# Patient Record
Sex: Female | Born: 1980 | Race: Black or African American | Hispanic: No | Marital: Single | Smoking: Never smoker
Health system: Southern US, Community
[De-identification: ages and names within clinical notes are randomized; demographics above are authoritative.]

## PROBLEM LIST (undated history)

## (undated) DIAGNOSIS — Z789 Other specified health status: Secondary | ICD-10-CM

## (undated) DIAGNOSIS — R87619 Unspecified abnormal cytological findings in specimens from cervix uteri: Secondary | ICD-10-CM

## (undated) DIAGNOSIS — IMO0002 Reserved for concepts with insufficient information to code with codable children: Secondary | ICD-10-CM

## (undated) HISTORY — PX: TONSILLECTOMY: SUR1361

## (undated) HISTORY — PX: COLPOSCOPY: SHX161

## (undated) HISTORY — DX: Other specified health status: Z78.9

## (undated) HISTORY — PX: DILATION AND CURETTAGE OF UTERUS: SHX78

---
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
## 2003-04-08 ENCOUNTER — Encounter: Admission: RE | Admit: 2003-04-08 | Discharge: 2003-04-08 | Payer: Self-pay | Admitting: Family Medicine

## 2003-04-15 ENCOUNTER — Encounter: Admission: RE | Admit: 2003-04-15 | Discharge: 2003-04-15 | Payer: Self-pay | Admitting: Obstetrics and Gynecology

## 2003-05-20 ENCOUNTER — Emergency Department (HOSPITAL_COMMUNITY): Admission: EM | Admit: 2003-05-20 | Discharge: 2003-05-20 | Payer: Self-pay | Admitting: Emergency Medicine

## 2003-05-22 ENCOUNTER — Emergency Department (HOSPITAL_COMMUNITY): Admission: EM | Admit: 2003-05-22 | Discharge: 2003-05-22 | Payer: Self-pay | Admitting: Emergency Medicine

## 2003-05-23 ENCOUNTER — Encounter: Payer: Self-pay | Admitting: Emergency Medicine

## 2005-06-09 ENCOUNTER — Inpatient Hospital Stay (HOSPITAL_COMMUNITY): Admission: AD | Admit: 2005-06-09 | Discharge: 2005-06-09 | Payer: Self-pay | Admitting: *Deleted

## 2005-06-10 ENCOUNTER — Ambulatory Visit (HOSPITAL_COMMUNITY): Admission: RE | Admit: 2005-06-10 | Discharge: 2005-06-10 | Payer: Self-pay | Admitting: Obstetrics

## 2005-06-10 ENCOUNTER — Encounter (INDEPENDENT_AMBULATORY_CARE_PROVIDER_SITE_OTHER): Payer: Self-pay | Admitting: *Deleted

## 2006-02-21 ENCOUNTER — Inpatient Hospital Stay (HOSPITAL_COMMUNITY): Admission: AD | Admit: 2006-02-21 | Discharge: 2006-02-21 | Payer: Self-pay | Admitting: Obstetrics

## 2006-07-11 ENCOUNTER — Ambulatory Visit (HOSPITAL_COMMUNITY): Admission: RE | Admit: 2006-07-11 | Discharge: 2006-07-11 | Payer: Self-pay | Admitting: Obstetrics

## 2006-11-22 ENCOUNTER — Inpatient Hospital Stay (HOSPITAL_COMMUNITY): Admission: AD | Admit: 2006-11-22 | Discharge: 2006-11-24 | Payer: Self-pay | Admitting: Obstetrics & Gynecology

## 2008-07-08 ENCOUNTER — Emergency Department (HOSPITAL_COMMUNITY): Admission: EM | Admit: 2008-07-08 | Discharge: 2008-07-08 | Payer: Self-pay | Admitting: Emergency Medicine

## 2009-11-27 ENCOUNTER — Ambulatory Visit (HOSPITAL_COMMUNITY): Admission: RE | Admit: 2009-11-27 | Discharge: 2009-11-27 | Payer: Self-pay | Admitting: Obstetrics

## 2010-04-15 ENCOUNTER — Inpatient Hospital Stay (HOSPITAL_COMMUNITY): Admission: AD | Admit: 2010-04-15 | Discharge: 2010-04-15 | Payer: Self-pay | Admitting: Obstetrics & Gynecology

## 2010-04-15 ENCOUNTER — Inpatient Hospital Stay (HOSPITAL_COMMUNITY): Admission: AD | Admit: 2010-04-15 | Discharge: 2010-04-18 | Payer: Self-pay | Admitting: Obstetrics & Gynecology

## 2010-04-15 ENCOUNTER — Encounter: Payer: Self-pay | Admitting: Obstetrics & Gynecology

## 2010-04-15 DIAGNOSIS — O321XX0 Maternal care for breech presentation, not applicable or unspecified: Secondary | ICD-10-CM

## 2010-08-29 ENCOUNTER — Emergency Department (HOSPITAL_COMMUNITY): Admission: EM | Admit: 2010-08-29 | Discharge: 2010-08-29 | Payer: Self-pay | Admitting: Emergency Medicine

## 2011-03-05 LAB — CBC
HCT: 31.5 % — ABNORMAL LOW (ref 36.0–46.0)
Hemoglobin: 10.5 g/dL — ABNORMAL LOW (ref 12.0–15.0)
MCHC: 33.3 g/dL (ref 30.0–36.0)
MCV: 90 fL (ref 78.0–100.0)
Platelets: 253 10*3/uL (ref 150–400)
RBC: 4.25 MIL/uL (ref 3.87–5.11)
RDW: 14.1 % (ref 11.5–15.5)

## 2011-03-05 LAB — RPR: RPR Ser Ql: NONREACTIVE

## 2011-05-03 NOTE — Op Note (Signed)
[name], Marie Watkins              ACCOUNT NO.:  [id number]   MEDICAL RECORD NO.:  [id number]          PATIENT TYPE:  AMB   LOCATION:  SDC                           FACILITY:  [facility]   PHYSICIAN:  Charles A. Clearance Coots, M.D.DATE OF BIRTH:  11-28-1981   DATE OF PROCEDURE:  06/10/2005  DATE OF DISCHARGE:                                 OPERATIVE REPORT   PREOPERATIVE DIAGNOSES:  Missed abortion.   POSTOPERATIVE DIAGNOSES:  Missed abortion.   PROCEDURE:  Suction, dilation and evacuation.   SURGEON:  Coral Ceo, MD   ANESTHESIA:  MAC with local paracervical block.   SPECIMEN:  Products of conception.   ESTIMATED BLOOD LOSS:  100 mL.   COMPLICATIONS:  None.   DESCRIPTION OF PROCEDURE:  The patient was brought to the operating room and  after satisfactory IV sedation, the legs were brought up in stirrups and the  vagina was prepped and draped in the usual sterile fashion.  The urinary  bladder was emptied of approximately 50 mL of clear urine. Bimanual  examination revealed the uterus to be slightly enlarged, anteverted. A  sterile speculum was inserted in the vaginal vault and the cervix was  isolated. Products of conception were seen in the cervical os and these  products of conception were grasped with ring forceps and submitted to  pathology for evaluation. The cervix was then dilated to a #25 Pratt  dilator. A #8 suction cath was then easily introduced into the uterine  cavity and all contents were evacuated. There was no active bleeding at the  conclusion of the procedure. All instruments were retired. The patient  tolerated the procedure well and was transported to the recovery room in  satisfactory condition.       CAH/MEDQ  D:  06/10/2005  T:  06/10/2005  Job:  [id number]

## 2011-09-13 LAB — POCT RAPID STREP A: Streptococcus, Group A Screen (Direct): POSITIVE — AB

## 2011-10-15 LAB — GC/CHLAMYDIA PROBE AMP, GENITAL
Chlamydia: NEGATIVE
Gonorrhea: NEGATIVE

## 2011-10-15 LAB — RUBELLA ANTIBODY, IGM: Rubella: IMMUNE

## 2011-10-15 LAB — ABO/RH: RH Type: POSITIVE

## 2011-12-11 LAB — RPR: RPR: NONREACTIVE

## 2011-12-17 NOTE — L&D Delivery Note (Signed)
Delivery Note At 4:04 PM a viable female was delivered via Vaginal, Spontaneous Delivery (Presentation: Right Occiput Anterior).  APGAR: , ; weight .   Placenta status: Intact, Spontaneous.  Cord:  with the following complications: .  Cord pH  Anesthesia: Epidural  Episiotomy: None Lacerations:  Suture Repair: 2.0 Est. Blood Loss (mL):   Mom to postpartum.  Baby to nursery-stable.  Alyzae Hawkey A 02/21/2012, 4:13 PM

## 2012-02-21 ENCOUNTER — Encounter (HOSPITAL_COMMUNITY): Payer: Self-pay | Admitting: Anesthesiology

## 2012-02-21 ENCOUNTER — Encounter (HOSPITAL_COMMUNITY): Payer: Self-pay

## 2012-02-21 ENCOUNTER — Inpatient Hospital Stay (HOSPITAL_COMMUNITY): Payer: Medicaid Other | Admitting: Anesthesiology

## 2012-02-21 ENCOUNTER — Inpatient Hospital Stay (HOSPITAL_COMMUNITY)
Admission: AD | Admit: 2012-02-21 | Discharge: 2012-02-23 | DRG: 775 | Disposition: A | Discharge status: Home / Self Care | Payer: Medicaid Other | Source: Ambulatory Visit | Attending: Obstetrics | Admitting: Obstetrics

## 2012-02-21 HISTORY — DX: Other specified health status: Z78.9

## 2012-02-21 HISTORY — DX: Reserved for concepts with insufficient information to code with codable children: IMO0002

## 2012-02-21 HISTORY — DX: Unspecified abnormal cytological findings in specimens from cervix uteri: R87.619

## 2012-02-21 LAB — RPR: RPR Ser Ql: NONREACTIVE

## 2012-02-21 LAB — CBC
MCH: 27.4 pg (ref 26.0–34.0)
MCHC: 32.1 g/dL (ref 30.0–36.0)
MCV: 85.4 fL (ref 78.0–100.0)
Platelets: 269 10*3/uL (ref 150–400)
RDW: 13.6 % (ref 11.5–15.5)

## 2012-02-21 LAB — STREP B DNA PROBE

## 2012-02-21 MED ORDER — SIMETHICONE 80 MG PO CHEW: 80.0000 mg | CHEWABLE_TABLET | ORAL | Status: DC | PRN

## 2012-02-21 MED ORDER — FENTANYL 2.5 MCG/ML BUPIVACAINE 1/10 % EPIDURAL INFUSION (WH - ANES)
14.0000 mL/h | INTRAMUSCULAR | Status: DC
  Administered 2012-02-21: 14 mL/h via EPIDURAL
  Filled 2012-02-21 (×2): qty 60

## 2012-02-21 MED ORDER — OXYTOCIN BOLUS FROM INFUSION
500.0000 mL | Freq: Once | INTRAVENOUS | Status: AC
  Administered 2012-02-21: 500 mL via INTRAVENOUS
  Filled 2012-02-21: qty 500
  Filled 2012-02-21: qty 1000

## 2012-02-21 MED ORDER — ONDANSETRON HCL 4 MG PO TABS: 4.0000 mg | ORAL_TABLET | ORAL | Status: DC | PRN

## 2012-02-21 MED ORDER — WITCH HAZEL-GLYCERIN EX PADS: 1.0000 "application " | MEDICATED_PAD | CUTANEOUS | Status: DC | PRN

## 2012-02-21 MED ORDER — FERROUS SULFATE 325 (65 FE) MG PO TABS
325.0000 mg | ORAL_TABLET | Freq: Two times a day (BID) | ORAL | Status: DC
  Administered 2012-02-22 – 2012-02-23 (×3): 325 mg via ORAL
  Filled 2012-02-21 (×3): qty 1

## 2012-02-21 MED ORDER — TETANUS-DIPHTH-ACELL PERTUSSIS 5-2.5-18.5 LF-MCG/0.5 IM SUSP
0.5000 mL | Freq: Once | INTRAMUSCULAR | Status: AC
  Administered 2012-02-22: 0.5 mL via INTRAMUSCULAR
  Filled 2012-02-21: qty 0.5

## 2012-02-21 MED ORDER — OXYTOCIN 20 UNITS IN LACTATED RINGERS INFUSION - SIMPLE: 125.0000 mL/h | Freq: Once | INTRAVENOUS | Status: DC

## 2012-02-21 MED ORDER — LACTATED RINGERS IV SOLN
INTRAVENOUS | Status: DC
  Administered 2012-02-21 (×2): via INTRAVENOUS

## 2012-02-21 MED ORDER — CITRIC ACID-SODIUM CITRATE 334-500 MG/5ML PO SOLN: 30.0000 mL | ORAL | Status: DC | PRN

## 2012-02-21 MED ORDER — BENZOCAINE-MENTHOL 20-0.5 % EX AERO: 1.0000 "application " | INHALATION_SPRAY | CUTANEOUS | Status: DC | PRN

## 2012-02-21 MED ORDER — EPHEDRINE 5 MG/ML INJ
10.0000 mg | INTRAVENOUS | Status: DC | PRN
  Filled 2012-02-21: qty 2
  Filled 2012-02-21: qty 4

## 2012-02-21 MED ORDER — DIPHENHYDRAMINE HCL 50 MG/ML IJ SOLN: 12.5000 mg | INTRAMUSCULAR | Status: DC | PRN

## 2012-02-21 MED ORDER — ONDANSETRON HCL 4 MG/2ML IJ SOLN: 4.0000 mg | INTRAMUSCULAR | Status: DC | PRN

## 2012-02-21 MED ORDER — NALBUPHINE SYRINGE 5 MG/0.5 ML
10.0000 mg | INJECTION | Freq: Four times a day (QID) | INTRAMUSCULAR | Status: DC | PRN
  Filled 2012-02-21: qty 1

## 2012-02-21 MED ORDER — ONDANSETRON HCL 4 MG/2ML IJ SOLN: 4.0000 mg | Freq: Four times a day (QID) | INTRAMUSCULAR | Status: DC | PRN

## 2012-02-21 MED ORDER — OXYCODONE-ACETAMINOPHEN 5-325 MG PO TABS: 1.0000 | ORAL_TABLET | ORAL | Status: DC | PRN

## 2012-02-21 MED ORDER — DIPHENHYDRAMINE HCL 25 MG PO CAPS: 25.0000 mg | ORAL_CAPSULE | Freq: Four times a day (QID) | ORAL | Status: DC | PRN

## 2012-02-21 MED ORDER — IBUPROFEN 600 MG PO TABS
600.0000 mg | ORAL_TABLET | Freq: Four times a day (QID) | ORAL | Status: DC
  Administered 2012-02-22 – 2012-02-23 (×6): 600 mg via ORAL
  Filled 2012-02-21 (×6): qty 1

## 2012-02-21 MED ORDER — PHENYLEPHRINE 40 MCG/ML (10ML) SYRINGE FOR IV PUSH (FOR BLOOD PRESSURE SUPPORT)
80.0000 ug | PREFILLED_SYRINGE | INTRAVENOUS | Status: DC | PRN
  Filled 2012-02-21: qty 2
  Filled 2012-02-21: qty 5

## 2012-02-21 MED ORDER — EPHEDRINE 5 MG/ML INJ
10.0000 mg | INTRAVENOUS | Status: DC | PRN
  Filled 2012-02-21: qty 2

## 2012-02-21 MED ORDER — PHENYLEPHRINE 40 MCG/ML (10ML) SYRINGE FOR IV PUSH (FOR BLOOD PRESSURE SUPPORT)
80.0000 ug | PREFILLED_SYRINGE | INTRAVENOUS | Status: DC | PRN
  Filled 2012-02-21: qty 2

## 2012-02-21 MED ORDER — SENNOSIDES-DOCUSATE SODIUM 8.6-50 MG PO TABS
2.0000 | ORAL_TABLET | Freq: Every day | ORAL | Status: DC
  Administered 2012-02-21 – 2012-02-22 (×2): 2 via ORAL

## 2012-02-21 MED ORDER — PRENATAL MULTIVITAMIN CH
1.0000 | ORAL_TABLET | Freq: Every day | ORAL | Status: DC
  Administered 2012-02-22 – 2012-02-23 (×2): 1 via ORAL
  Filled 2012-02-21 (×2): qty 1

## 2012-02-21 MED ORDER — ACETAMINOPHEN 325 MG PO TABS: 650.0000 mg | ORAL_TABLET | ORAL | Status: DC | PRN

## 2012-02-21 MED ORDER — LACTATED RINGERS IV SOLN: 500.0000 mL | INTRAVENOUS | Status: DC | PRN

## 2012-02-21 MED ORDER — NALBUPHINE SYRINGE 5 MG/0.5 ML
10.0000 mg | INJECTION | INTRAMUSCULAR | Status: DC | PRN
  Filled 2012-02-21: qty 1

## 2012-02-21 MED ORDER — LACTATED RINGERS IV SOLN
500.0000 mL | Freq: Once | INTRAVENOUS | Status: AC
  Administered 2012-02-21: 500 mL via INTRAVENOUS

## 2012-02-21 MED ORDER — LIDOCAINE HCL (PF) 1 % IJ SOLN
30.0000 mL | INTRAMUSCULAR | Status: DC | PRN
  Filled 2012-02-21 (×2): qty 30

## 2012-02-21 MED ORDER — HYDROXYZINE HCL 50 MG/ML IM SOLN
50.0000 mg | Freq: Four times a day (QID) | INTRAMUSCULAR | Status: DC | PRN
  Filled 2012-02-21: qty 1

## 2012-02-21 MED ORDER — DIBUCAINE 1 % RE OINT: 1.0000 "application " | TOPICAL_OINTMENT | RECTAL | Status: DC | PRN

## 2012-02-21 MED ORDER — ZOLPIDEM TARTRATE 5 MG PO TABS: 5.0000 mg | ORAL_TABLET | Freq: Every evening | ORAL | Status: DC | PRN

## 2012-02-21 MED ORDER — OXYCODONE-ACETAMINOPHEN 5-325 MG PO TABS
1.0000 | ORAL_TABLET | ORAL | Status: DC | PRN
  Administered 2012-02-22: 1 via ORAL
  Filled 2012-02-21: qty 1

## 2012-02-21 MED ORDER — LANOLIN HYDROUS EX OINT: TOPICAL_OINTMENT | CUTANEOUS | Status: DC | PRN

## 2012-02-21 MED ORDER — SODIUM BICARBONATE 8.4 % IV SOLN
INTRAVENOUS | Status: DC | PRN
  Administered 2012-02-21: 4 mL via EPIDURAL

## 2012-02-21 MED ORDER — IBUPROFEN 600 MG PO TABS: 600.0000 mg | ORAL_TABLET | Freq: Four times a day (QID) | ORAL | Status: DC | PRN

## 2012-02-21 MED ORDER — FLEET ENEMA 7-19 GM/118ML RE ENEM: 1.0000 | ENEMA | RECTAL | Status: DC | PRN

## 2012-02-21 MED ORDER — FENTANYL 2.5 MCG/ML BUPIVACAINE 1/10 % EPIDURAL INFUSION (WH - ANES)
INTRAMUSCULAR | Status: DC | PRN
  Administered 2012-02-21: 14 mL/h via EPIDURAL

## 2012-02-21 NOTE — Progress Notes (Signed)
Marie Watkins is a 31 y.o. G5P3 at [redacted]w[redacted]d by LMP admitted for active labor  Subjective:   Objective: BP 117/75  Pulse 87  Temp(Src) 97.9 F (36.6 C) (Oral)  Resp 20  Ht 5\' 5"  (1.651 m)  Wt 191 lb (86.637 kg)  BMI 31.78 kg/m2  LMP 06/16/2011      FHT:  FHR: 150 bpm, variability: moderate,  accelerations:  Present,  decelerations:  Absent UC:   regular, every 3 minutes SVE:   Dilation: 3 Effacement (%): 100 Station: -1 Exam by:: Laurell Josephs RN  Labs: Lab Results  Component Value Date   WBC 14.5* 02/21/2012   HGB 11.3* 02/21/2012   HCT 35.2* 02/21/2012   MCV 85.4 02/21/2012   PLT 269 02/21/2012    Assessment / Plan: Spontaneous labor, progressing normally  Labor: Progressing normally Preeclampsia:  n/a Fetal Wellbeing:  Category I Pain Control:  Labor support without medications I/D:  n/a Anticipated MOD:  NSVD  Jaquay Morneault A 02/21/2012, 10:49 AM

## 2012-02-21 NOTE — Anesthesia Procedure Notes (Signed)

## 2012-02-21 NOTE — H&P (Signed)
Marie Watkins is a 31 y.o. female presenting for uterine contractions. Maternal Medical History:  Reason for admission: Reason for admission: contractions.  Contractions: Onset was 3-5 hours ago.   Frequency: regular.   Duration is approximately 1 minute.   Perceived severity is strong.    Fetal activity: Perceived fetal activity is normal.   Last perceived fetal movement was within the past hour.    Prenatal complications: no prenatal complications Prenatal Complications - Diabetes: none.    OB History    Grav Para Term Preterm Abortions TAB SAB Ect Mult Living   5 3        3      Past Medical History  Diagnosis Date  . No pertinent past medical history   . Abnormal Pap smear    Past Surgical History  Procedure Date  . Colposcopy   . Cesarean section   . Tonsillectomy    Family History: family history is not on file. Social History:  reports that she has quit smoking. She has never used smokeless tobacco. She reports that she does not drink alcohol or use illicit drugs.  Review of Systems  All other systems reviewed and are negative.    Dilation: 3 Effacement (%): 100 Station: -1 Exam by:: Laurell Josephs RN Blood pressure 117/75, pulse 87, temperature 97.9 F (36.6 C), temperature source Oral, resp. rate 20, height 5\' 5"  (1.651 m), weight 191 lb (86.637 kg), last menstrual period 06/16/2011. Maternal Exam:  Uterine Assessment: Contraction strength is firm.  Abdomen: Fetal presentation: vertex  Introitus: Normal vulva. Normal vagina.  Cervix: Cervix evaluated by digital exam.     Physical Exam  Constitutional: She is oriented to person, place, and time. She appears well-developed and well-nourished.  Eyes: Conjunctivae are normal. Pupils are equal, round, and reactive to light.  Neck: Normal range of motion. Neck supple.  Cardiovascular: Normal rate and regular rhythm.   Respiratory: Effort normal.  GI: Soft.  Genitourinary: Vagina normal and uterus  normal.  Musculoskeletal: Normal range of motion.  Neurological: She is alert and oriented to person, place, and time.  Skin: Skin is warm and dry.  Psychiatric: She has a normal mood and affect. Her behavior is normal. Judgment and thought content normal.    Prenatal labs: ABO, Rh:   Antibody:   Rubella:   RPR:    HBsAg:    HIV:    GBS:     Assessment/Plan: 35.5 weeks.  Active labor.  Admit.  Expectant management.   Tildon Silveria A 02/21/2012, 10:44 AM

## 2012-02-21 NOTE — Anesthesia Preprocedure Evaluation (Signed)

## 2012-02-21 NOTE — Progress Notes (Signed)
Marie Watkins is a 31 y.o. [phone] at [redacted]w[redacted]d by ultrasound admitted for active labor  Subjective:   Objective: BP 140/74  Pulse 118  Temp(Src) 98.2 F (36.8 C) (Oral)  Resp 20  Ht 5\' 5"  (1.651 m)  Wt 191 lb (86.637 kg)  BMI 31.78 kg/m2  LMP 06/16/2011      FHT:  FHR: 150 bpm, variability: moderate,  accelerations:  Present,  decelerations:  Absent UC:   regular, every 3 minutes SVE:   Dilation: 6.5 Effacement (%): 100 Station: -1 Exam by:: Isac Sarna, RN  Labs: Lab Results  Component Value Date   WBC 14.5* 02/21/2012   HGB 11.3* 02/21/2012   HCT 35.2* 02/21/2012   MCV 85.4 02/21/2012   PLT 269 02/21/2012    Assessment / Plan: Spontaneous labor, progressing normally  Labor: Progressing normally Preeclampsia:  n/a Fetal Wellbeing:  Category I Pain Control:  Epidural I/D:  n/a Anticipated MOD:  NSVD  Zafar Debrosse A 02/21/2012, 2:06 PM

## 2012-02-22 LAB — CBC
HCT: 34.9 % — ABNORMAL LOW (ref 36.0–46.0)
Platelets: 247 10*3/uL (ref 150–400)
RDW: 13.8 % (ref 11.5–15.5)
WBC: 14 10*3/uL — ABNORMAL HIGH (ref 4.0–10.5)

## 2012-02-22 NOTE — Progress Notes (Signed)
Patient ID: Marie Watkins, female   DOB: 08-02-1981, 31 y.o.   MRN: [id number] Postpartum day one Bile signs normal Fundus firm Legs negative No complaints and

## 2012-02-22 NOTE — Anesthesia Postprocedure Evaluation (Signed)
  Anesthesia Post-op Note  Patient: Marie Watkins  Procedure(s) Performed: * No procedures listed *  Patient Location: PACU and Mother/Baby  Anesthesia Type: Epidural  Level of Consciousness: awake, alert  and oriented  Airway and Oxygen Therapy: Patient Spontanous Breathing  Post-op Pain: none  Post-op Assessment: Post-op Vital signs reviewed and Patient's Cardiovascular Status Stable  Post-op Vital Signs: Reviewed and stable  Complications: No apparent anesthesia complications

## 2012-02-23 NOTE — Discharge Summary (Signed)
Obstetric Discharge Summary Reason for Admission: onset of labor Prenatal Procedures: none Intrapartum Procedures: spontaneous vaginal delivery Postpartum Procedures: none Complications-Operative and Postpartum: none Hemoglobin  Date Value Range Status  02/22/2012 11.1* 12.0-15.0 (g/dL) Final     HCT  Date Value Range Status  02/22/2012 34.9* 36.0-46.0 (%) Final    Discharge Diagnoses: Term Pregnancy-delivered  Discharge Information: Date: 02/23/2012 Activity: pelvic rest Diet: routine Medications: Percocet Condition: stable Instructions: refer to practice specific booklet Discharge to: home Follow-up Information    Follow up with HARPER,CHARLES A, MD. Call in 6 weeks.   Contact information:   [address] [place] [address] [phone]          Newborn Data: Live born female  Birth Weight: 5 lb 11.4 oz (2591 g) APGAR: 8, 9  Home with mother.  Kasen Sako A 02/23/2012, 6:05 AM

## 2012-02-23 NOTE — Discharge Instructions (Signed)
Discharge instructions   You can wash your hair  Shower  Eat what you want  Drink what you want  See me in 6 weeks  Your ankles are going to swell more in the next 2 weeks than when pregnant  No sex for 6 weeks   Koston Hennes A, MD 02/23/2012    

## 2012-02-25 NOTE — Progress Notes (Signed)
Post discharge chart review completed.  

## 2013-03-05 ENCOUNTER — Emergency Department (HOSPITAL_COMMUNITY)
Admission: EM | Admit: 2013-03-05 | Discharge: 2013-03-05 | Disposition: A | Discharge status: Home / Self Care | Payer: No Typology Code available for payment source | Source: Home / Self Care | Attending: Family Medicine | Admitting: Family Medicine

## 2013-03-05 ENCOUNTER — Encounter (HOSPITAL_COMMUNITY): Payer: Self-pay | Admitting: *Deleted

## 2013-03-05 DIAGNOSIS — Z3201 Encounter for pregnancy test, result positive: Secondary | ICD-10-CM

## 2013-03-05 DIAGNOSIS — K047 Periapical abscess without sinus: Secondary | ICD-10-CM

## 2013-03-05 DIAGNOSIS — R21 Rash and other nonspecific skin eruption: Secondary | ICD-10-CM

## 2013-03-05 DIAGNOSIS — Z3490 Encounter for supervision of normal pregnancy, unspecified, unspecified trimester: Secondary | ICD-10-CM

## 2013-03-05 MED ORDER — NYSTATIN-TRIAMCINOLONE 100000-0.1 UNIT/GM-% EX OINT: TOPICAL_OINTMENT | Freq: Two times a day (BID) | CUTANEOUS | Status: DC

## 2013-03-05 MED ORDER — HYDROCODONE-ACETAMINOPHEN 5-325 MG PO TABS: 1.0000 | ORAL_TABLET | Freq: Four times a day (QID) | ORAL | Status: DC | PRN

## 2013-03-05 MED ORDER — PENICILLIN V POTASSIUM 500 MG PO TABS: 500.0000 mg | ORAL_TABLET | Freq: Four times a day (QID) | ORAL | Status: AC

## 2013-03-05 NOTE — ED Notes (Signed)
Referral faxed to [facility]-waiting for an appt 

## 2013-03-05 NOTE — ED Provider Notes (Signed)
History     CSN: [id number]  Arrival date & time 03/05/13  1259   None     Chief Complaint  Patient presents with  . Dental Pain    (Consider location/radiation/quality/duration/timing/severity/associated sxs/prior treatment) HPI Comments: Pt reports part of tooth broke off 2 months ago, and for last 2 weeks has been painful. Also c/o rash for a month on neck not relieved by neosporin or ring worm cream or hydrocortisone. Pt has not had a period since November, but checked a pregnancy test last month and it was negative.    Patient is a 32 y.o. female presenting with tooth pain. The history is provided by the patient.  Dental PainThe primary symptoms include mouth pain. Primary symptoms do not include fever. Episode onset: 2 weeks ago. The symptoms are worsening. The symptoms are new. The symptoms occur constantly.  Additional symptoms include: gum swelling. Medical issues include: periodontal disease.    Past Medical History  Diagnosis Date  . No pertinent past medical history   . Abnormal Pap smear     Past Surgical History  Procedure Laterality Date  . Colposcopy    . Cesarean section    . Tonsillectomy      History reviewed. No pertinent family history.  History  Substance Use Topics  . Smoking status: Former Games developer  . Smokeless tobacco: Never Used  . Alcohol Use: No    OB History   Grav Para Term Preterm Abortions TAB SAB Ect Mult Living   6 4 3 1 2  0 2 0 0 4      Review of Systems  Constitutional: Negative for fever and chills.  HENT: Positive for dental problem.   Skin: Positive for rash.    Allergies  Review of patient's allergies indicates no known allergies.  Home Medications   Current Outpatient Rx  Name  Route  Sig  Dispense  Refill  . HYDROcodone-acetaminophen (NORCO/VICODIN) 5-325 MG per tablet   Oral   Take 1-2 tablets by mouth every 6 (six) hours as needed for pain.   10 tablet   0   . nystatin-triamcinolone ointment (MYCOLOG)  Topical   Apply topically 2 (two) times daily.   30 g   0   . penicillin v potassium (VEETID) 500 MG tablet   Oral   Take 1 tablet (500 mg total) by mouth 4 (four) times daily.   40 tablet   0     BP 127/90  Pulse 88  Temp(Src) 98.6 F (37 C) (Oral)  Resp 14  SpO2 99%  LMP 11/12/2012  Breastfeeding? No  Physical Exam  Constitutional: She appears well-developed and well-nourished. No distress.  HENT:  Mouth/Throat: Dental abscesses and dental caries present.    Dentition in poor repair  Cardiovascular: Normal rate and regular rhythm.   Pulmonary/Chest: Effort normal and breath sounds normal.  Abdominal: Soft. Bowel sounds are normal. She exhibits no distension. There is no tenderness.  Skin: Skin is warm, dry and intact. Rash noted. Rash is maculopapular.  Rash across back of neck    ED Course  Procedures (including critical care time)  Labs Reviewed  POCT PREGNANCY, URINE - Abnormal; Notable for the following:    Preg Test, Ur POSITIVE (*)    All other components within normal limits   No results found.   1. Dental abscess   2. Rash of neck   3. Pregnancy       MDM  Unable to palpate fundus so pt is likely  still in early pregnancy.  Discussed options. Pt wants to pursue abortion. Referred to [facility]. Rash could be a tinea or a contact derm. Will treat with mycolog.  Pt to f/u with dentist for further care of broken tooth.         Cathlyn Parsons, NP 03/05/13 1505

## 2013-03-05 NOTE — ED Notes (Signed)
Left lower dental pain for 2 weeks    She recently had a root canal on the same tooth.  Denies fever.   Also she c/o rash on her neck the past month or two

## 2013-03-06 NOTE — ED Provider Notes (Signed)
Medical screening examination/treatment/procedure(s) were performed by non-physician practitioner and as supervising physician I was immediately available for consultation/collaboration.   MORENO-COLL,Jemar Paulsen; MD  Brilynn Biasi Moreno-Coll, MD 03/06/13 1247 

## 2013-03-09 NOTE — ED Notes (Signed)
[facility]

## 2013-09-14 ENCOUNTER — Emergency Department (INDEPENDENT_AMBULATORY_CARE_PROVIDER_SITE_OTHER)
Admission: EM | Admit: 2013-09-14 | Discharge: 2013-09-14 | Disposition: A | Discharge status: Home / Self Care | Payer: Self-pay | Source: Home / Self Care | Attending: Family Medicine | Admitting: Family Medicine

## 2013-09-14 ENCOUNTER — Encounter (HOSPITAL_COMMUNITY): Payer: Self-pay | Admitting: Emergency Medicine

## 2013-09-14 DIAGNOSIS — M5412 Radiculopathy, cervical region: Secondary | ICD-10-CM

## 2013-09-14 MED ORDER — PREDNISONE 10 MG PO KIT: PACK | ORAL | Status: DC

## 2013-09-14 MED ORDER — TRAMADOL HCL 50 MG PO TABS: 50.0000 mg | ORAL_TABLET | Freq: Four times a day (QID) | ORAL | Status: DC | PRN

## 2013-09-14 NOTE — ED Provider Notes (Signed)
Marie Watkins is a 32 y.o. female who presents to Urgent Care today for right arm pain from right shoulder to right thumb. Pain is not worse with activity. Does not seem to be better with rest. She denies any neck pain or pain with neck motion. She denies any weakness or numbness. She describes the pain is a e are sharp. She denies any injury. She has not tried any medications yet. It sometimes bothersome. She feels well otherwise.   Past Medical History  Diagnosis Date  . No pertinent past medical history   . Abnormal Pap smear    History  Substance Use Topics  . Smoking status: Former Games developer  . Smokeless tobacco: Never Used  . Alcohol Use: No   ROS as above Medications reviewed. No current facility-administered medications for this encounter.   Current Outpatient Prescriptions  Medication Sig Dispense Refill  . PredniSONE 10 MG KIT 12 day dose pack po  1 kit  0  . traMADol (ULTRAM) 50 MG tablet Take 1 tablet (50 mg total) by mouth every 6 (six) hours as needed for pain.  15 tablet  0    Exam:  BP 130/81  Pulse 62  Temp(Src) 98.7 F (37.1 C) (Oral)  Resp 12  SpO2 100%  LMP 08/09/2013  Breastfeeding? No Gen: Well NAD NECK: Nontender to spinal midline normal neck range of motion negative Spurling's test bilaterally Right shoulder: Nontender normal motion negative impingement testing strength is intact full motion Left shoulder: Full motion and strength nontender negative impingement testing Right elbow: Nontender full motion full-strength Left elbow: Nontender full motion full-strength Right hand and wrist: Normal-appearing nontender full motion negative Finkelstein's test Left hand and wrist: Full motion nontender Grip strength bilaterally is equal and normal Sensation capillary fill are intact distally bilateral upper extremity  No results found for this or any previous visit (from the past 24 hour(s)). No results found.  Assessment and Plan: 32 y.o. female with  likely cervical radicular pain to the right arm.  Plan to treat with prednisone Dosepak and tramadol Followup with [facility] if not improving Discussed warning signs or symptoms. Please see discharge instructions. Patient expresses understanding.      Rodolph Bong, MD 09/14/13 [phone]

## 2013-09-14 NOTE — ED Notes (Signed)
C/o right arm pain that comes and goes. Onset x 1 month ago. Pt states that she has a sharp pain that radiates from shoulder through out arm down to thumb. Denies injury. Pt has not tried any otc meds for symptoms.

## 2013-09-27 ENCOUNTER — Ambulatory Visit: Payer: Self-pay

## 2013-12-23 ENCOUNTER — Other Ambulatory Visit (HOSPITAL_COMMUNITY)
Admission: RE | Admit: 2013-12-23 | Discharge: 2013-12-23 | Disposition: A | Discharge status: Home / Self Care | Payer: No Typology Code available for payment source | Source: Ambulatory Visit | Attending: Internal Medicine | Admitting: Internal Medicine

## 2013-12-23 ENCOUNTER — Encounter: Payer: Self-pay | Admitting: Internal Medicine

## 2013-12-23 ENCOUNTER — Ambulatory Visit: Payer: No Typology Code available for payment source | Attending: Internal Medicine | Admitting: Internal Medicine

## 2013-12-23 VITALS — BP 133/91 | HR 83 | Temp 98.4°F | Resp 16 | Ht 66.0 in | Wt 164.0 lb

## 2013-12-23 DIAGNOSIS — Z0000 Encounter for general adult medical examination without abnormal findings: Secondary | ICD-10-CM

## 2013-12-23 DIAGNOSIS — Z1151 Encounter for screening for human papillomavirus (HPV): Secondary | ICD-10-CM | POA: Insufficient documentation

## 2013-12-23 DIAGNOSIS — Z113 Encounter for screening for infections with a predominantly sexual mode of transmission: Secondary | ICD-10-CM | POA: Insufficient documentation

## 2013-12-23 DIAGNOSIS — N760 Acute vaginitis: Secondary | ICD-10-CM | POA: Insufficient documentation

## 2013-12-23 DIAGNOSIS — Z01419 Encounter for gynecological examination (general) (routine) without abnormal findings: Secondary | ICD-10-CM | POA: Insufficient documentation

## 2013-12-23 LAB — CBC WITH DIFFERENTIAL/PLATELET
Basophils Absolute: 0 10*3/uL (ref 0.0–0.1)
Basophils Relative: 0 % (ref 0–1)
EOS ABS: 0.1 10*3/uL (ref 0.0–0.7)
Eosinophils Relative: 1 % (ref 0–5)
HCT: 37.3 % (ref 36.0–46.0)
HEMOGLOBIN: 12.8 g/dL (ref 12.0–15.0)
LYMPHS ABS: 3.3 10*3/uL (ref 0.7–4.0)
LYMPHS PCT: 36 % (ref 12–46)
MCH: 27.7 pg (ref 26.0–34.0)
MCHC: 34.3 g/dL (ref 30.0–36.0)
MCV: 80.7 fL (ref 78.0–100.0)
MONOS PCT: 7 % (ref 3–12)
Monocytes Absolute: 0.6 10*3/uL (ref 0.1–1.0)
NEUTROS ABS: 5.2 10*3/uL (ref 1.7–7.7)
NEUTROS PCT: 56 % (ref 43–77)
PLATELETS: 264 10*3/uL (ref 150–400)
RBC: 4.62 MIL/uL (ref 3.87–5.11)
RDW: 13.9 % (ref 11.5–15.5)
WBC: 9.2 10*3/uL (ref 4.0–10.5)

## 2013-12-23 LAB — CMP AND LIVER
ALT: 10 U/L (ref 0–35)
AST: 14 U/L (ref 0–37)
Albumin: 4.6 g/dL (ref 3.5–5.2)
Alkaline Phosphatase: 62 U/L (ref 39–117)
BILIRUBIN DIRECT: 0.1 mg/dL (ref 0.0–0.3)
BILIRUBIN INDIRECT: 0.2 mg/dL (ref 0.0–0.9)
BILIRUBIN TOTAL: 0.3 mg/dL (ref 0.3–1.2)
BUN: 12 mg/dL (ref 6–23)
CALCIUM: 9.7 mg/dL (ref 8.4–10.5)
CHLORIDE: 102 meq/L (ref 96–112)
CO2: 26 meq/L (ref 19–32)
CREATININE: 0.86 mg/dL (ref 0.50–1.10)
Glucose, Bld: 78 mg/dL (ref 70–99)
Potassium: 3.7 mEq/L (ref 3.5–5.3)
Sodium: 137 mEq/L (ref 135–145)
Total Protein: 7.9 g/dL (ref 6.0–8.3)

## 2013-12-23 LAB — LIPID PANEL
CHOL/HDL RATIO: 3.7 ratio
CHOLESTEROL: 173 mg/dL (ref 0–200)
HDL: 47 mg/dL (ref >39)
LDL CALC: 114 mg/dL — AB (ref 0–99)
TRIGLYCERIDES: 61 mg/dL (ref <150)
VLDL: 12 mg/dL (ref 0–40)

## 2013-12-23 NOTE — Progress Notes (Signed)
Pt is here to establish care.  Pt is requesting a physical and a pap smear.  

## 2013-12-23 NOTE — Patient Instructions (Signed)
DASH Diet  The DASH diet stands for "Dietary Approaches to Stop Hypertension." It is a healthy eating plan that has been shown to reduce high blood pressure (hypertension) in as little as 14 days, while also possibly providing other significant health benefits. These other health benefits include reducing the risk of breast cancer after menopause and reducing the risk of type 2 diabetes, heart disease, colon cancer, and stroke. Health benefits also include weight loss and slowing kidney failure in patients with chronic kidney disease.   DIET GUIDELINES  · Limit salt (sodium). Your diet should contain less than 1500 mg of sodium daily.  · Limit refined or processed carbohydrates. Your diet should include mostly whole grains. Desserts and added sugars should be used sparingly.  · Include small amounts of heart-healthy fats. These types of fats include nuts, oils, and tub margarine. Limit saturated and trans fats. These fats have been shown to be harmful in the body.  CHOOSING FOODS   The following food groups are based on a 2000 calorie diet. See your Registered Dietitian for individual calorie needs.  Grains and Grain Products (6 to 8 servings daily)  · Eat More Often: Whole-wheat bread, brown rice, whole-grain or wheat pasta, quinoa, popcorn without added fat or salt (air popped).  · Eat Less Often: White bread, white pasta, white rice, cornbread.  Vegetables (4 to 5 servings daily)  · Eat More Often: Fresh, frozen, and canned vegetables. Vegetables may be raw, steamed, roasted, or grilled with a minimal amount of fat.  · Eat Less Often/Avoid: Creamed or fried vegetables. Vegetables in a cheese sauce.  Fruit (4 to 5 servings daily)  · Eat More Often: All fresh, canned (in natural juice), or frozen fruits. Dried fruits without added sugar. One hundred percent fruit juice (½ cup [237 mL] daily).  · Eat Less Often: Dried fruits with added sugar. Canned fruit in light or heavy syrup.  Lean Meats, Fish, and Poultry (2  servings or less daily. One serving is 3 to 4 oz [85-114 g]).  · Eat More Often: Ninety percent or leaner ground beef, tenderloin, sirloin. Round cuts of beef, chicken breast, turkey breast. All fish. Grill, bake, or broil your meat. Nothing should be fried.  · Eat Less Often/Avoid: Fatty cuts of meat, turkey, or chicken leg, thigh, or wing. Fried cuts of meat or fish.  Dairy (2 to 3 servings)  · Eat More Often: Low-fat or fat-free milk, low-fat plain or light yogurt, reduced-fat or part-skim cheese.  · Eat Less Often/Avoid: Milk (whole, 2%). Whole milk yogurt. Full-fat cheeses.  Nuts, Seeds, and Legumes (4 to 5 servings per week)  · Eat More Often: All without added salt.  · Eat Less Often/Avoid: Salted nuts and seeds, canned beans with added salt.  Fats and Sweets (limited)  · Eat More Often: Vegetable oils, tub margarines without trans fats, sugar-free gelatin. Mayonnaise and salad dressings.  · Eat Less Often/Avoid: Coconut oils, palm oils, butter, stick margarine, cream, half and half, cookies, candy, pie.  FOR MORE INFORMATION  The Dash Diet Eating Plan: www.dashdiet.org  Document Released: 11/21/2011 Document Revised: 02/24/2012 Document Reviewed: 11/21/2011  ExitCare® Patient Information ©2014 [employer].

## 2013-12-23 NOTE — Progress Notes (Signed)
**Note De-Identified Marie Obfuscation** Patient ID: Marie Watkins, female   DOB: March 17, 1981, 33 y.o.   MRN: [id number] Patient Demographics  Marie Watkins, is a 33 y.o. female  SEZ:[id number]  UQM:[id number]  DOB - 02-Feb-1981  CC:  Chief Complaint  Patient presents with  . Establish Care       HPI: Marie Watkins is a 33 y.o. female here today to establish medical care. Patient has no significant past medical history, she's here for annual physical examination. She has 4 children, married, last child about 3 years, last pregnancy was February 2014, terminated. She's not on any medication. She has good social support, doing well at home and at work, she works as a Furniture conservator/restorer. She does not smoke cigarette, she does not drink alcohol. Patient has No headache, No chest pain, No abdominal pain - No Nausea, No new weakness tingling or numbness, No Cough - SOB.  No Known Allergies Past Medical History  Diagnosis Date  . No pertinent past medical history   . Abnormal Pap smear    Current Outpatient Prescriptions on File Prior to Visit  Medication Sig Dispense Refill  . PredniSONE 10 MG KIT 12 day dose pack po  1 kit  0  . traMADol (ULTRAM) 50 MG tablet Take 1 tablet (50 mg total) by mouth every 6 (six) hours as needed for pain.  15 tablet  0   No current facility-administered medications on file prior to visit.   History reviewed. No pertinent family history. History   Social History  . Marital Status: Single    Spouse Name: N/A    Number of Children: N/A  . Years of Education: N/A   Occupational History  . Not on file.   Social History Main Topics  . Smoking status: Former Games developer  . Smokeless tobacco: Never Used  . Alcohol Use: No  . Drug Use: No  . Sexual Activity: Yes    Birth Control/ Protection: None   Other Topics Concern  . Not on file   Social History Narrative  . No narrative on file    Review of Systems: Constitutional: Negative for fever, chills, diaphoresis, activity change, appetite change  and fatigue. HENT: Negative for ear pain, nosebleeds, congestion, facial swelling, rhinorrhea, neck pain, neck stiffness and ear discharge.  Eyes: Negative for pain, discharge, redness, itching and visual disturbance. Respiratory: Negative for cough, choking, chest tightness, shortness of breath, wheezing and stridor.  Cardiovascular: Negative for chest pain, palpitations and leg swelling. Gastrointestinal: Negative for abdominal distention. Genitourinary: Negative for dysuria, urgency, frequency, hematuria, flank pain, decreased urine volume, difficulty urinating and dyspareunia.  Musculoskeletal: Negative for back pain, joint swelling, arthralgia and gait problem. Neurological: Negative for dizziness, tremors, seizures, syncope, facial asymmetry, speech difficulty, weakness, light-headedness, numbness and headaches.  Hematological: Negative for adenopathy. Does not bruise/bleed easily. Psychiatric/Behavioral: Negative for hallucinations, behavioral problems, confusion, dysphoric mood, decreased concentration and agitation.    Objective:   Filed Vitals:   12/23/13 1419  BP: 133/91  Pulse: 83  Temp: 98.4 F (36.9 C)  Resp: 16    Physical Exam: Constitutional: Patient appears well-developed and well-nourished. No distress. HENT: Normocephalic, atraumatic, External right and left ear normal. Oropharynx is clear and moist.  Eyes: Conjunctivae and EOM are normal. PERRLA, no scleral icterus. Neck: Normal ROM. Neck supple. No JVD. No tracheal deviation. No thyromegaly. CVS: RRR, S1/S2 +, no murmurs, no gallops, no carotid bruit.  Pulmonary: Effort and breath sounds normal, no stridor, rhonchi, wheezes, rales.  Abdominal: Soft. BS +, no distension,  tenderness, rebound or guarding.  Musculoskeletal: Normal range of motion. No edema and no tenderness.  Lymphadenopathy: No lymphadenopathy noted, cervical, inguinal or axillary Neuro: Alert. Normal reflexes, muscle tone coordination. No cranial  nerve deficit. Skin: Skin is warm and dry. No rash noted. Not diaphoretic. No erythema. No pallor. Psychiatric: Normal mood and affect. Behavior, judgment, thought content normal.  Lab Results  Component Value Date   WBC 14.0* 02/22/2012   HGB 11.1* 02/22/2012   HCT 34.9* 02/22/2012   MCV 85.7 02/22/2012   PLT 247 02/22/2012   No results found for this basename: CREATININE, BUN, NA, K, CL, CO2    No results found for this basename: HGBA1C   Lipid Panel  No results found for this basename: chol, trig, hdl, cholhdl, vldl, ldlcalc       Assessment and plan:   1. Preventative health care  - Cytology - PAP [place] - Cervicovaginal ancillary only - CMP and Liver - CBC with Differential - Urinalysis, Complete - Lipid panel  Patient extensively counseled about nutrition and exercise  Follow up in 3-6 months or when necessary   The patient was given clear instructions to go to ER or return to medical center if symptoms don't improve, worsen or new problems develop. The patient verbalized understanding. The patient was told to call to get lab results if they haven't heard anything in the next week.     Angelica Chessman, MD, [place], [place], [place], [place]   12/23/2013, 2:44 PM

## 2013-12-24 LAB — URINALYSIS, COMPLETE
Bacteria, UA: NONE SEEN
Bilirubin Urine: NEGATIVE
CASTS: NONE SEEN
Crystals: NONE SEEN
Glucose, UA: NEGATIVE mg/dL
HGB URINE DIPSTICK: NEGATIVE
Ketones, ur: NEGATIVE mg/dL
LEUKOCYTES UA: NEGATIVE
NITRITE: NEGATIVE
PH: 6 (ref 5.0–8.0)
Protein, ur: NEGATIVE mg/dL
SPECIFIC GRAVITY, URINE: 1.028 (ref 1.005–1.030)
Urobilinogen, UA: 0.2 mg/dL (ref 0.0–1.0)

## 2013-12-31 ENCOUNTER — Telehealth: Payer: Self-pay | Admitting: Emergency Medicine

## 2013-12-31 NOTE — Telephone Encounter (Signed)
Left message for pt to call clinic for result

## 2013-12-31 NOTE — Telephone Encounter (Signed)
Message copied by Darlis LoanSMITH, JILL D on Fri Dec 31, 2013  5:47 PM ------      Message from: Quentin AngstJEGEDE, OLUGBEMIGA E      Created: Fri Dec 31, 2013  4:14 PM       Please inform patient that her Pap smear result is normal ------

## 2014-01-04 ENCOUNTER — Other Ambulatory Visit: Payer: Self-pay | Admitting: Emergency Medicine

## 2014-01-04 ENCOUNTER — Telehealth: Payer: Self-pay | Admitting: Emergency Medicine

## 2014-01-04 MED ORDER — METRONIDAZOLE 500 MG PO TABS: 2000.0000 mg | ORAL_TABLET | Freq: Once | ORAL | Status: DC

## 2014-01-04 NOTE — Telephone Encounter (Signed)
Pt called in requesting pap smear/blood work results Results given and script for Flagyl 2g e-scribed to [facility]

## 2014-05-12 DIAGNOSIS — M79609 Pain in unspecified limb: Secondary | ICD-10-CM

## 2014-10-17 ENCOUNTER — Encounter: Payer: Self-pay | Admitting: Internal Medicine

## 2014-12-16 NOTE — L&D Delivery Note (Signed)
Delivery Note At 4:54 AM a viable female was delivered via Vaginal, Spontaneous Delivery (Presentation: ; Occiput Anterior).  APGAR: , ; weight  .   Placenta status: Intact, Spontaneous.  Cord: 3 vessels with the following complications: None.  Cord pH: not done  Anesthesia: None  Episiotomy: None Lacerations: None Suture Repair: 2.0 Est. Blood Loss (mL): 142  Mom to postpartum.  Baby to Couplet care / Skin to Skin.  Rithvik Orcutt A 06/10/2015, 5:25 AM

## 2014-12-19 ENCOUNTER — Telehealth: Payer: Self-pay | Admitting: *Deleted

## 2014-12-19 NOTE — Telephone Encounter (Signed)
Patient is interested in scheduling a NOB appointment. Attempted to contact patient and left message for her to contact the office.

## 2014-12-20 NOTE — Telephone Encounter (Signed)
Patient scheduled for NOB appointment on 01-12-15. Patient encouraged to take prenatal vitamins. Given safe OTC meds for nausea. Patient made aware of no show policy. Patient verbalized understanding.

## 2014-12-26 ENCOUNTER — Telehealth: Payer: Self-pay | Admitting: *Deleted

## 2014-12-26 NOTE — Telephone Encounter (Signed)
Patient requesting a call back.  Attempted to contact the patient and left message for patient to call the office.

## 2015-01-02 NOTE — Telephone Encounter (Signed)
Patient states she was told by someone that she was too far along to be seen here and would need to be [facility]. Advised patient that our cutoff for accepting new pregnant patients is 24 weeks. Patient advised only [facility] if [facility] is an emergency. Patient advised she would need to bring her confirmation of pregnancy to her first appointment.

## 2015-01-12 ENCOUNTER — Other Ambulatory Visit: Payer: Self-pay | Admitting: Obstetrics

## 2015-01-12 ENCOUNTER — Telehealth: Payer: Self-pay

## 2015-01-12 ENCOUNTER — Ambulatory Visit (INDEPENDENT_AMBULATORY_CARE_PROVIDER_SITE_OTHER): Payer: Medicaid Other | Admitting: Obstetrics

## 2015-01-12 ENCOUNTER — Encounter: Payer: Self-pay | Admitting: Obstetrics

## 2015-01-12 VITALS — BP 126/78 | HR 110 | Temp 97.6°F | Wt 176.0 lb

## 2015-01-12 DIAGNOSIS — Z3490 Encounter for supervision of normal pregnancy, unspecified, unspecified trimester: Secondary | ICD-10-CM

## 2015-01-12 DIAGNOSIS — O09212 Supervision of pregnancy with history of pre-term labor, second trimester: Secondary | ICD-10-CM

## 2015-01-12 DIAGNOSIS — Z3482 Encounter for supervision of other normal pregnancy, second trimester: Secondary | ICD-10-CM

## 2015-01-12 DIAGNOSIS — O09892 Supervision of other high risk pregnancies, second trimester: Secondary | ICD-10-CM

## 2015-01-12 LAB — POCT URINALYSIS DIPSTICK
Bilirubin, UA: NEGATIVE
Glucose, UA: NEGATIVE
KETONES UA: NEGATIVE
LEUKOCYTES UA: NEGATIVE
NITRITE UA: NEGATIVE
PROTEIN UA: NEGATIVE
Spec Grav, UA: 1.01
Urobilinogen, UA: NEGATIVE
pH, UA: 6.5

## 2015-01-12 NOTE — Telephone Encounter (Signed)
Called patient with appt date and time for ultrasound, etc at [facility] on Feb 2nd at 8am

## 2015-01-13 ENCOUNTER — Encounter: Payer: Self-pay | Admitting: Obstetrics

## 2015-01-13 LAB — OBSTETRIC PANEL
ANTIBODY SCREEN: NEGATIVE
BASOS ABS: 0 10*3/uL (ref 0.0–0.1)
BASOS PCT: 0 % (ref 0–1)
Eosinophils Absolute: 0.1 10*3/uL (ref 0.0–0.7)
Eosinophils Relative: 1 % (ref 0–5)
HCT: 35.7 % — ABNORMAL LOW (ref 36.0–46.0)
HEMOGLOBIN: 12 g/dL (ref 12.0–15.0)
HEP B S AG: NEGATIVE
LYMPHS ABS: 2.6 10*3/uL (ref 0.7–4.0)
Lymphocytes Relative: 20 % (ref 12–46)
MCH: 27.7 pg (ref 26.0–34.0)
MCHC: 33.6 g/dL (ref 30.0–36.0)
MCV: 82.4 fL (ref 78.0–100.0)
MONO ABS: 1.3 10*3/uL — AB (ref 0.1–1.0)
MPV: 9.6 fL (ref 8.6–12.4)
Monocytes Relative: 10 % (ref 3–12)
NEUTROS ABS: 8.9 10*3/uL — AB (ref 1.7–7.7)
NEUTROS PCT: 69 % (ref 43–77)
Platelets: 324 10*3/uL (ref 150–400)
RBC: 4.33 MIL/uL (ref 3.87–5.11)
RDW: 13.5 % (ref 11.5–15.5)
RH TYPE: POSITIVE
RUBELLA: 1.71 {index} — AB (ref <0.90)
WBC: 12.9 10*3/uL — AB (ref 4.0–10.5)

## 2015-01-13 LAB — HIV ANTIBODY (ROUTINE TESTING W REFLEX): HIV 1&2 Ab, 4th Generation: NONREACTIVE

## 2015-01-13 LAB — AFP, QUAD SCREEN
AFP: 50.2 ng/mL
Age Alone: 1:572 {titer}
Curr Gest Age: 17.3 wks.days
HCG TOTAL: 62.06 [IU]/mL
INH: 221.7 pg/mL
Interpretation-AFP: NEGATIVE
MOM FOR HCG: 2.11
MoM for AFP: 1.2
MoM for INH: 1.4
Open Spina bifida: NEGATIVE
Osb Risk: 1:13800 {titer}
Tri 18 Scr Risk Est: NEGATIVE
Trisomy 18 (Edward) Syndrome Interp.: 1:28000 {titer}
UE3 MOM: 0.67
uE3 Value: 0.74 ng/mL

## 2015-01-13 LAB — PAP IG AND HPV HIGH-RISK: HPV DNA High Risk: NOT DETECTED

## 2015-01-13 LAB — CULTURE, OB URINE: Colony Count: 50000

## 2015-01-13 LAB — VITAMIN D 25 HYDROXY (VIT D DEFICIENCY, FRACTURES): Vit D, 25-Hydroxy: 14 ng/mL — ABNORMAL LOW (ref 30–100)

## 2015-01-13 LAB — OB RESULTS CONSOLE GBS: GBS: POSITIVE

## 2015-01-13 LAB — VARICELLA ZOSTER ANTIBODY, IGG: Varicella IgG: 988.3 Index — ABNORMAL HIGH (ref <135.00)

## 2015-01-13 NOTE — Progress Notes (Signed)
Subjective:    Marie Watkins is being seen today for her first obstetrical visit.  This is not a planned pregnancy. She is at 6716w4d gestation. Her obstetrical history is significant for H/O preterm delivery.  Patient does intend to breast feed. Pregnancy history fully reviewed.  The information documented in the HPI was reviewed and verified.  Menstrual History: OB History    Gravida Para Term Preterm AB TAB SAB Ectopic Multiple Living   7 4 3 1 2  0 2 0 0 4      Patient's last menstrual period was 09/12/2014.    Past Medical History  Diagnosis Date  . No pertinent past medical history   . Abnormal Pap smear   . Medical history non-contributory     Past Surgical History  Procedure Laterality Date  . Colposcopy    . Cesarean section    . Tonsillectomy    . Dilation and curettage of uterus       (Not in a hospital admission) No Known Allergies  History  Substance Use Topics  . Smoking status: Former Games developermoker  . Smokeless tobacco: Never Used  . Alcohol Use: No    Family History  Problem Relation Age of Onset  . Family history unknown: Yes     Review of Systems Constitutional: negative for weight loss Gastrointestinal: negative for vomiting Genitourinary:negative for genital lesions and vaginal discharge and dysuria Musculoskeletal:negative for back pain Behavioral/Psych: negative for abusive relationship, depression, illegal drug usage and tobacco use    Objective:    BP 126/78 mmHg  Pulse 110  Temp(Src) 97.6 F (36.4 C)  Wt 176 lb (79.833 kg)  LMP 09/12/2014 General Appearance:    Alert, cooperative, no distress, appears stated age  Head:    Normocephalic, without obvious abnormality, atraumatic  Eyes:    PERRL, conjunctiva/corneas clear, EOM's intact, fundi    benign, both eyes  Ears:    Normal TM's and external ear canals, both ears  Nose:   Nares normal, septum midline, mucosa normal, no drainage    or sinus tenderness  Throat:   Lips, mucosa, and  tongue normal; teeth and gums normal  Neck:   Supple, symmetrical, trachea midline, no adenopathy;    thyroid:  no enlargement/tenderness/nodules; no carotid   bruit or JVD  Back:     Symmetric, no curvature, ROM normal, no CVA tenderness  Lungs:     Clear to auscultation bilaterally, respirations unlabored  Chest Wall:    No tenderness or deformity   Heart:    Regular rate and rhythm, S1 and S2 normal, no murmur, rub   or gallop  Breast Exam:    No tenderness, masses, or nipple abnormality  Abdomen:     Soft, non-tender, bowel sounds active all four quadrants,    no masses, no organomegaly  Genitalia:    Normal female without lesion, discharge or tenderness  Extremities:   Extremities normal, atraumatic, no cyanosis or edema  Pulses:   2+ and symmetric all extremities  Skin:   Skin color, texture, turgor normal, no rashes or lesions  Lymph nodes:   Cervical, supraclavicular, and axillary nodes normal  Neurologic:   CNII-XII intact, normal strength, sensation and reflexes    throughout      Lab Review Urine pregnancy test Labs reviewed yes Radiologic studies reviewed no Assessment:    Pregnancy at 5616w4d weeks    Plan:      Prenatal vitamins.  Counseling provided regarding continued use of seat belts, cessation  of alcohol consumption, smoking or use of illicit drugs; infection precautions i.e., influenza/TDAP immunizations, toxoplasmosis,CMV, parvovirus, listeria and varicella; workplace safety, exercise during pregnancy; routine dental care, safe medications, sexual activity, hot tubs, saunas, pools, travel, caffeine use, fish and methlymercury, potential toxins, hair treatments, varicose veins Weight gain recommendations per IOM guidelines reviewed: underweight/BMI< 18.5--> gain 28 - 40 lbs; normal weight/BMI 18.5 - 24.9--> gain 25 - 35 lbs; overweight/BMI 25 - 29.9--> gain 15 - 25 lbs; obese/BMI >30->gain  11 - 20 lbs Problem list reviewed and updated. FIRST/CF mutation  testing/NIPT/QUAD SCREEN/fragile X/[name] population testing/Spinal muscular atrophy discussed: requested. Role of ultrasound in pregnancy discussed; fetal survey: requested. Amniocentesis discussed: not indicated. VBAC calculator score: VBAC consent form provided No orders of the defined types were placed in this encounter.   Orders Placed This Encounter  Procedures  . Culture, OB Urine  . SureSwab, Vaginosis/Vaginitis Plus  . US OB Comp + 14 Wk    Standing Status: Future     Number of Occurrences:      Standing Expiration Date: 03/12/2016    Order Specific Question:  Reason for Exam (SYMPTOM  OR DIAGNOSIS REQUIRED)    Answer:  Previous preterm delivery    Order Specific Question:  Preferred imaging location?    Answer:  [facility]  . Obstetric panel  . HIV antibody  . Hemoglobinopathy evaluation  . Varicella zoster antibody, IgG  . Vit D  25 hydroxy (rtn osteoporosis monitoring)  . AFP, Quad Screen    Order Specific Question:  Repeat Sample    Answer:  No    Order Specific Question:  Maternal Race    Answer:  african american    Order Specific Question:  EDD    Answer:  06/19/2015    Order Specific Question:  Brief History NTD    Answer:  no    Order Specific Question:  Pregnancy Donor Egg (Y/N)    Answer:  No    Order Specific Question:  Gest Age at U/S (Wk.Dy)    Answer:  none    Order Specific Question:  LMP:    Answer:  09/12/2014    Order Specific Question:  Number of Fetuses    Answer:  1    Order Specific Question:  Hx of OSB/NTD?    Answer:  No    Order Specific Question:  History of Down Syndrome?    Answer:  No    Order Specific Question:  Maternal IDDM (insulin-dependent diabetes mellitus)    Answer:  No    Order Specific Question:  Maternal Weight (lbs)    Answer:  176    Order Specific Question:  Brief History DS    Answer:  none  . AMB Referral to Maternal Fetal Medicine (MFM)    Referral Priority:  Routine    Referral Type:  Consultation     Number of Visits Requested:  1  . POCT urinalysis dipstick    Follow up in 4 weeks.

## 2015-01-14 ENCOUNTER — Other Ambulatory Visit: Payer: Self-pay | Admitting: Obstetrics

## 2015-01-14 DIAGNOSIS — N390 Urinary tract infection, site not specified: Secondary | ICD-10-CM

## 2015-01-14 MED ORDER — AMOXICILLIN 500 MG PO CAPS: 500.0000 mg | ORAL_CAPSULE | Freq: Three times a day (TID) | ORAL | Status: DC

## 2015-01-16 ENCOUNTER — Other Ambulatory Visit: Payer: Self-pay | Admitting: *Deleted

## 2015-01-16 DIAGNOSIS — O219 Vomiting of pregnancy, unspecified: Secondary | ICD-10-CM

## 2015-01-16 LAB — SURESWAB, VAGINOSIS/VAGINITIS PLUS
Atopobium vaginae: NOT DETECTED Log (cells/mL)
C. ALBICANS, DNA: DETECTED — AB
C. glabrata, DNA: NOT DETECTED
C. parapsilosis, DNA: NOT DETECTED
C. trachomatis RNA, TMA: NOT DETECTED
C. tropicalis, DNA: NOT DETECTED
Gardnerella vaginalis: NOT DETECTED Log (cells/mL)
LACTOBACILLUS SPECIES: 5.4 Log (cells/mL)
MEGASPHAERA SPECIES: NOT DETECTED Log (cells/mL)
N. gonorrhoeae RNA, TMA: NOT DETECTED
T. vaginalis RNA, QL TMA: NOT DETECTED

## 2015-01-16 LAB — HEMOGLOBINOPATHY EVALUATION
HEMOGLOBIN OTHER: 0 %
HGB A2 QUANT: 2.9 % (ref 2.2–3.2)
HGB F QUANT: 0 % (ref 0.0–2.0)
Hgb A: 97.1 % (ref 96.8–97.8)
Hgb S Quant: 0 %

## 2015-01-16 MED ORDER — ONDANSETRON 4 MG PO TBDP: 4.0000 mg | ORAL_TABLET | Freq: Four times a day (QID) | ORAL | Status: DC | PRN

## 2015-01-16 MED ORDER — OB COMPLETE PETITE 35-5-1-200 MG PO CAPS: 1.0000 | ORAL_CAPSULE | Freq: Every day | ORAL | Status: DC

## 2015-01-16 NOTE — Progress Notes (Signed)
PNV sent to pharmacy 

## 2015-01-17 ENCOUNTER — Other Ambulatory Visit: Payer: Self-pay | Admitting: Obstetrics

## 2015-01-17 ENCOUNTER — Ambulatory Visit (HOSPITAL_COMMUNITY)
Admission: RE | Admit: 2015-01-17 | Discharge: 2015-01-17 | Disposition: A | Discharge status: Home / Self Care | Payer: Medicaid Other | Source: Ambulatory Visit | Attending: Obstetrics | Admitting: Obstetrics

## 2015-01-17 ENCOUNTER — Other Ambulatory Visit (HOSPITAL_COMMUNITY): Payer: Self-pay | Admitting: Maternal and Fetal Medicine

## 2015-01-17 ENCOUNTER — Encounter (HOSPITAL_COMMUNITY): Payer: Self-pay

## 2015-01-17 VITALS — BP 129/78 | HR 97 | Wt 179.8 lb

## 2015-01-17 DIAGNOSIS — O4402 Placenta previa specified as without hemorrhage, second trimester: Secondary | ICD-10-CM | POA: Diagnosis not present

## 2015-01-17 DIAGNOSIS — O09212 Supervision of pregnancy with history of pre-term labor, second trimester: Principal | ICD-10-CM

## 2015-01-17 DIAGNOSIS — O3421 Maternal care for scar from previous cesarean delivery: Secondary | ICD-10-CM | POA: Insufficient documentation

## 2015-01-17 DIAGNOSIS — Z3A18 18 weeks gestation of pregnancy: Secondary | ICD-10-CM | POA: Diagnosis not present

## 2015-01-17 DIAGNOSIS — O09892 Supervision of other high risk pregnancies, second trimester: Secondary | ICD-10-CM

## 2015-01-17 DIAGNOSIS — Z87891 Personal history of nicotine dependence: Secondary | ICD-10-CM | POA: Insufficient documentation

## 2015-01-17 DIAGNOSIS — B3731 Acute candidiasis of vulva and vagina: Secondary | ICD-10-CM

## 2015-01-17 DIAGNOSIS — B373 Candidiasis of vulva and vagina: Secondary | ICD-10-CM

## 2015-01-17 MED ORDER — FLUCONAZOLE 150 MG PO TABS: 150.0000 mg | ORAL_TABLET | Freq: Once | ORAL | Status: DC

## 2015-01-17 NOTE — Consult Note (Signed)
Maternal Fetal Medicine Consultation  Requesting Provider(s): Coral Ceo, MD  Reason for consultation: History of preterm delivery at 35 weeks  HPI: Marie Watkins is a 34 yo [id number] currently at 18w 1d who was seen for consultation due to a history of a previous preterm delivery at 35 weeks. Marie Watkins reports a history of two previous term SVDs and a prior term C-section for breech presentation.  Her most recent delivery in 2013 was a spontaneous preterm delivery at 35 weeks without complications.  Her prenatal course thus far has otherwise been uncomplicated.  She is without complaints today.  OB History: OB History    Gravida Para Term Preterm AB TAB SAB Ectopic Multiple Living   0 2 0 0 4      PMH:  Past Medical History  Diagnosis Date  . No pertinent past medical history   . Abnormal Pap smear   . Medical history non-contributory     PSH:  Past Surgical History  Procedure Laterality Date  . Colposcopy    . Cesarean section    . Tonsillectomy    . Dilation and curettage of uterus     Meds:  Current Outpatient Prescriptions on File Prior to Encounter  Medication Sig Dispense Refill  . amoxicillin (AMOXIL) 500 MG capsule Take 1 capsule (500 mg total) by mouth 3 (three) times daily. 21 capsule 0  . ondansetron (ZOFRAN ODT) 4 MG disintegrating tablet Take 1 tablet (4 mg total) by mouth every 6 (six) hours as needed for nausea or vomiting. 30 tablet 2  . Prenat-FeCbn-FeAspGl-FA-Omega (OB COMPLETE PETITE) 35-5-1-200 MG CAPS Take 1 capsule by mouth daily. 30 capsule 11   No current facility-administered medications on file prior to encounter.   Allergies: No Known Allergies   FH: Family History  Problem Relation Age of Onset  . Family history unknown: Yes   Soc: History   Social History  . Marital Status: Single    Spouse Name: N/A    Number of Children: N/A  . Years of Education: N/A   Occupational History  . Not on file.   Social History  Main Topics  . Smoking status: Former Games developer  . Smokeless tobacco: Never Used  . Alcohol Use: No  . Drug Use: No  . Sexual Activity: Yes    Birth Control/ Protection: None   Other Topics Concern  . Not on file   Social History Narrative    Review of Systems: no vaginal bleeding or cramping/contractions, no LOF, no nausea/vomiting. All other systems reviewed and are negative.  PE:   Filed Vitals:   01/17/15 0843  BP: 129/78  Pulse: 97    GEN: well-appearing female ABD: gravid, NT  Ultrasound: Single IUP at 18w 1d Hx of previous preterm delivery at 35 weeks Limited views of the fetal face were obtained The remainder of the fetal anatomy appears normal No markers associated with aneuploidy were appreciated A posterior placenta previa is noted Normal amniotic fluid volume  A/P: 1) Single IUP at 18w 1d         2) Hx of previous preterm delivery at 35 weeks - based on this history, the patient is a candidate for 17-P injections.  Would offer this intervention prior to [redacted] weeks gestation and continue with weekly injections until [redacted] weeks gestation.           3) Placenta previa - recommend follow up ultrasound in 6 weeks to reevaluate placental location and to  complete fetal anatomy.  Bleeding precautions / pelvic rest were discussed.   Thank you for the opportunity to be a part of the care of Marie Watkins. Please contact our office if we can be of further assistance.   I spent approximately 30 minutes with this patient with over 50% of time spent in face-to-face counseling.  Alpha GulaPaul Kabao Leite, MD Maternal Fetal Medicine

## 2015-01-18 ENCOUNTER — Encounter: Payer: Self-pay | Admitting: Obstetrics

## 2015-01-18 DIAGNOSIS — O4400 Placenta previa specified as without hemorrhage, unspecified trimester: Secondary | ICD-10-CM | POA: Insufficient documentation

## 2015-02-09 ENCOUNTER — Ambulatory Visit (INDEPENDENT_AMBULATORY_CARE_PROVIDER_SITE_OTHER): Payer: Medicaid Other | Admitting: Obstetrics

## 2015-02-09 VITALS — BP 129/77 | HR 98 | Temp 97.1°F | Wt 184.0 lb

## 2015-02-09 DIAGNOSIS — Z3482 Encounter for supervision of other normal pregnancy, second trimester: Secondary | ICD-10-CM

## 2015-02-09 LAB — POCT URINALYSIS DIPSTICK
Bilirubin, UA: NEGATIVE
Blood, UA: NEGATIVE
GLUCOSE UA: NEGATIVE
Ketones, UA: NEGATIVE
Leukocytes, UA: NEGATIVE
Nitrite, UA: NEGATIVE
PROTEIN UA: NEGATIVE
Spec Grav, UA: 1.01
Urobilinogen, UA: NEGATIVE
pH, UA: 7

## 2015-02-10 ENCOUNTER — Encounter: Payer: Self-pay | Admitting: Obstetrics

## 2015-02-10 NOTE — Progress Notes (Signed)
Subjective:    Marie Watkins is a 34 y.o. female being seen today for her obstetrical visit. She is at 6879w4d gestation. Patient reports: no complaints . Fetal movement: normal.  Problem List Items Addressed This Visit    None    Visit Diagnoses    Encounter for supervision of other normal pregnancy in second trimester    -  Primary    Relevant Orders    POCT urinalysis dipstick (Completed)      Patient Active Problem List   Diagnosis Date Noted  . Placenta previa antepartum 01/18/2015   Objective:    BP 129/77 mmHg  Pulse 98  Temp(Src) 97.1 F (36.2 C)  Wt 184 lb (83.462 kg)  LMP 09/12/2014 FHT: 150 BPM  Uterine Size: size equals dates     Assessment:    Pregnancy @ 4679w4d    Plan:    OBGCT: ordered for next visit.  Labs, problem list reviewed and updated 2 hr GTT planned Follow up in 4 weeks.

## 2015-02-28 ENCOUNTER — Ambulatory Visit (HOSPITAL_COMMUNITY)
Admission: RE | Admit: 2015-02-28 | Discharge: 2015-02-28 | Disposition: A | Discharge status: Home / Self Care | Payer: Medicaid Other | Source: Ambulatory Visit | Attending: Obstetrics | Admitting: Obstetrics

## 2015-02-28 ENCOUNTER — Other Ambulatory Visit (HOSPITAL_COMMUNITY): Payer: Self-pay | Admitting: Maternal and Fetal Medicine

## 2015-02-28 DIAGNOSIS — Z0489 Encounter for examination and observation for other specified reasons: Secondary | ICD-10-CM | POA: Insufficient documentation

## 2015-02-28 DIAGNOSIS — IMO0002 Reserved for concepts with insufficient information to code with codable children: Secondary | ICD-10-CM | POA: Insufficient documentation

## 2015-02-28 DIAGNOSIS — O09212 Supervision of pregnancy with history of pre-term labor, second trimester: Secondary | ICD-10-CM | POA: Insufficient documentation

## 2015-02-28 DIAGNOSIS — O4402 Placenta previa specified as without hemorrhage, second trimester: Secondary | ICD-10-CM

## 2015-02-28 DIAGNOSIS — O09899 Supervision of other high risk pregnancies, unspecified trimester: Secondary | ICD-10-CM | POA: Insufficient documentation

## 2015-02-28 DIAGNOSIS — O09219 Supervision of pregnancy with history of pre-term labor, unspecified trimester: Secondary | ICD-10-CM

## 2015-02-28 DIAGNOSIS — O4412 Placenta previa with hemorrhage, second trimester: Secondary | ICD-10-CM | POA: Diagnosis not present

## 2015-02-28 DIAGNOSIS — O09892 Supervision of other high risk pregnancies, second trimester: Secondary | ICD-10-CM

## 2015-02-28 DIAGNOSIS — Z3A24 24 weeks gestation of pregnancy: Secondary | ICD-10-CM | POA: Insufficient documentation

## 2015-02-28 IMAGING — US US OB FOLLOW-UP
1 series · 12 of 28 positions shown · non-contrast
Comparison: none

[Series 1: us ob follow-up · 0.26mm/px · 12 of 56 slices shown]
[im 3/56]
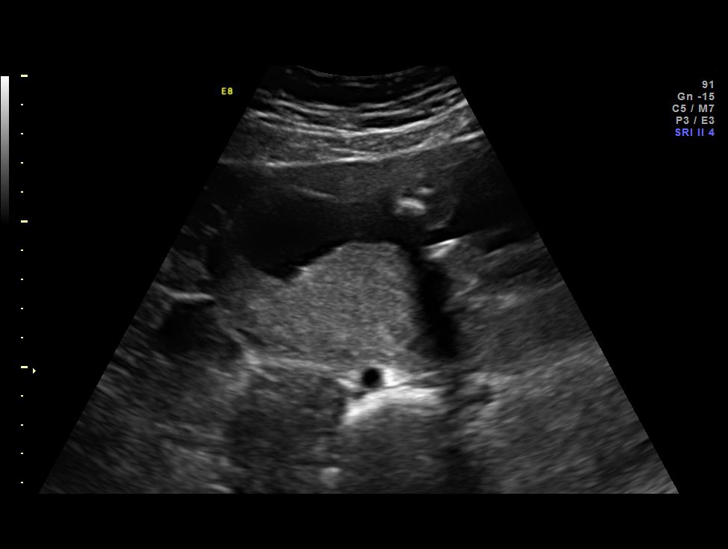
[im 7/56]
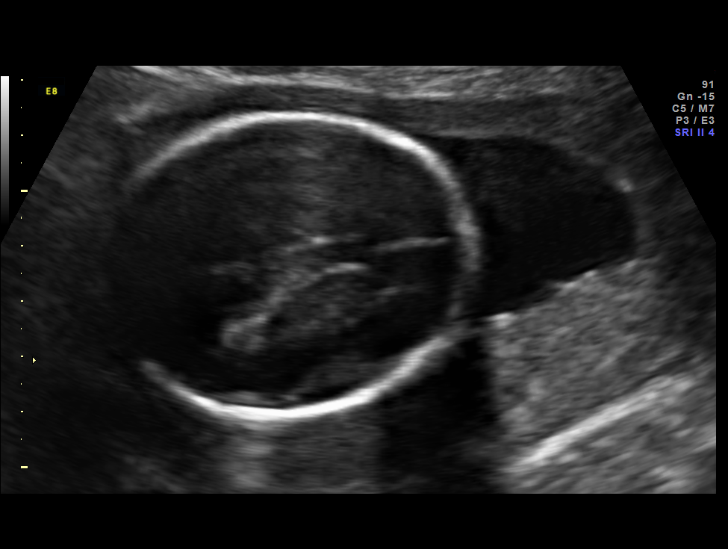
[im 11/56]
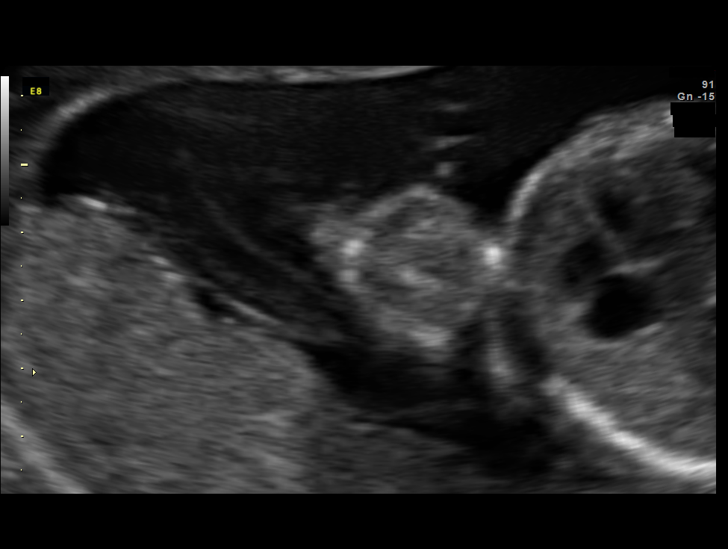
[im 17/56]
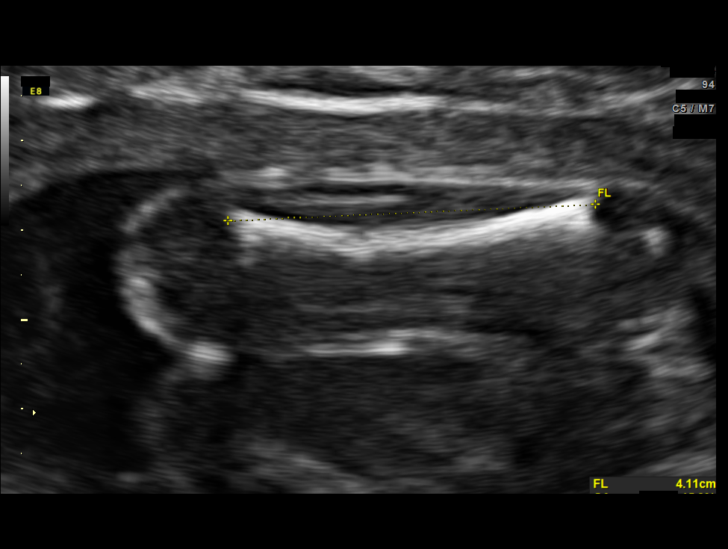
[im 21/56]
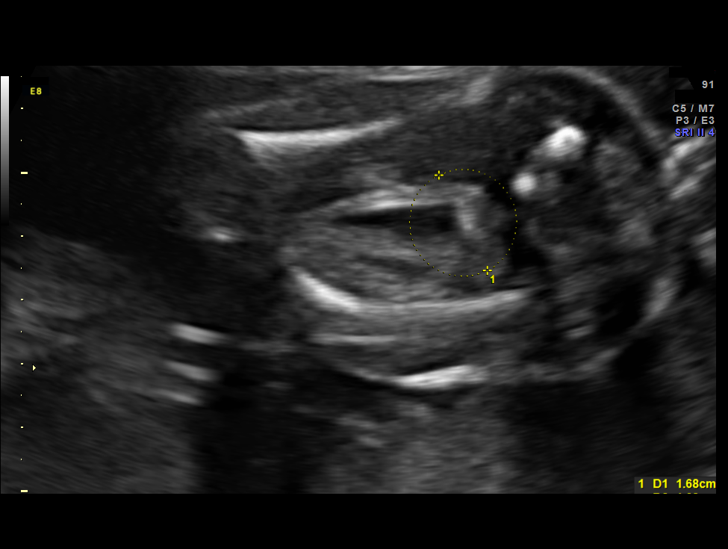
[im 25/56]
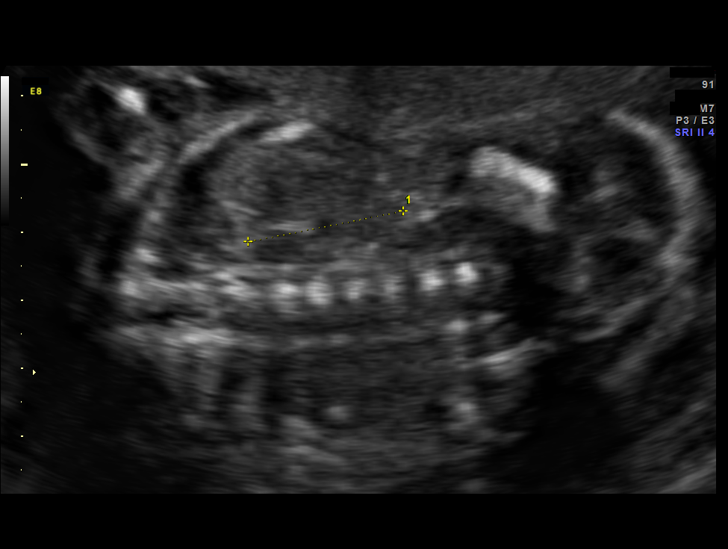
[im 31/56]
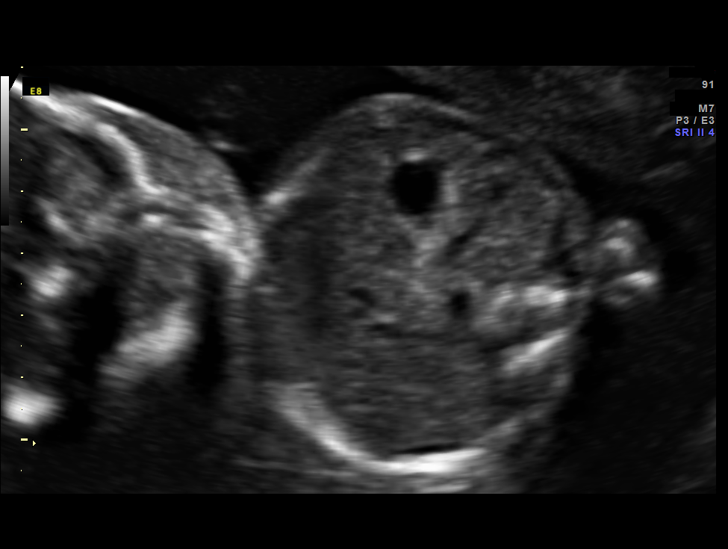
[im 35/56]
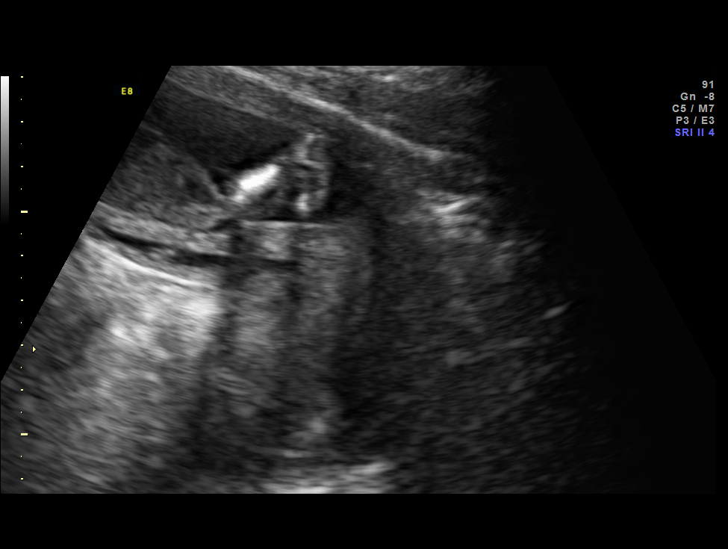
[im 39/56]
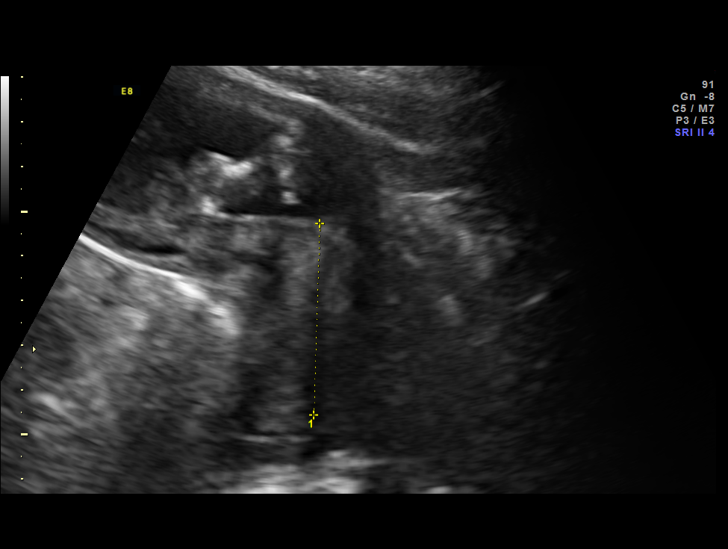
[im 45/56]
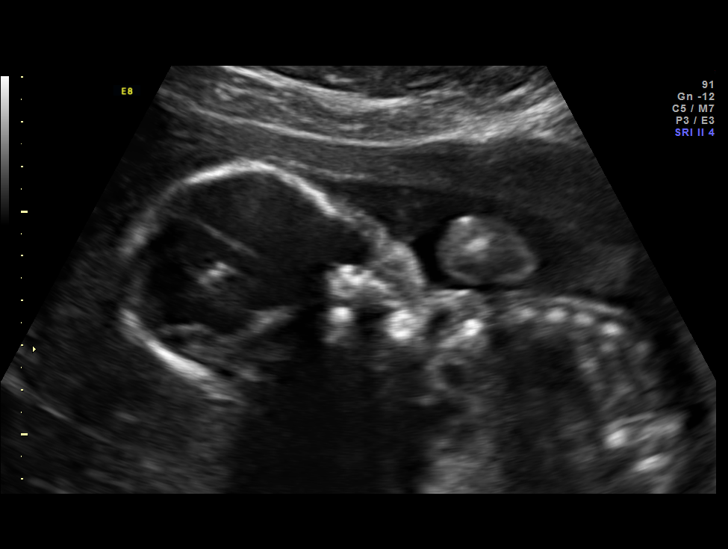
[im 49/56]
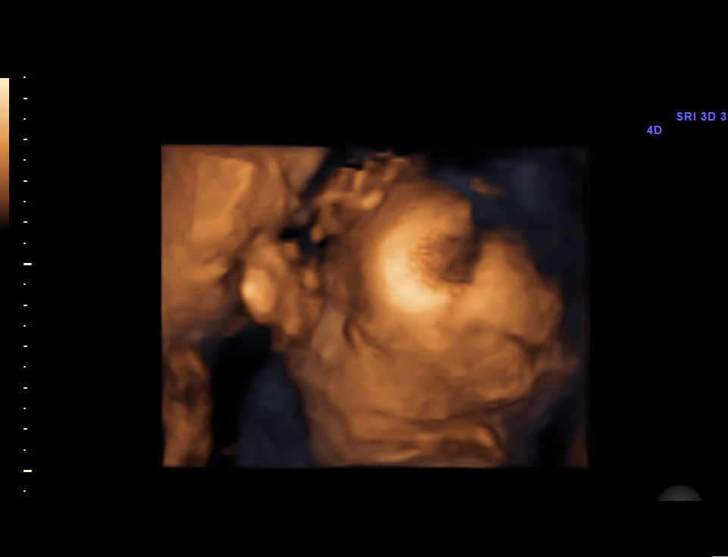
[im 53/56]
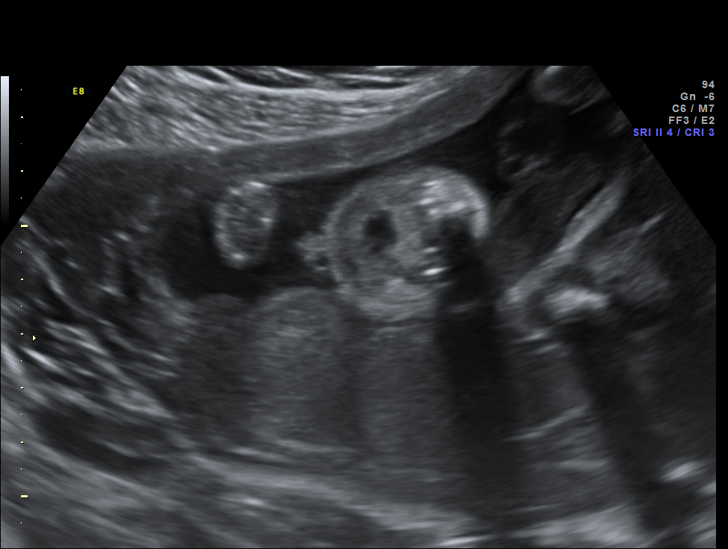

[12 of 28 positions shown; findings below may reference images not displayed]

OBSTETRICS REPORT
                      (Signed Final [DATE] [DATE])

Service(s) Provided

 US OB FOLLOW UP                                       76816.1
Indications

 Poor obstetric history: Previous preterm delivery 35  [NR]
 weeks
 24 weeks gestation of pregnancy
 Previous cesarean section                             [NR]
 Placenta previa specified as without hemorrhage,      [NR]
 second trimester
 Follow-up incomplete fetal anatomic evaluation        Z36
Fetal Evaluation

 Num Of Fetuses:    1
 Fetal Heart Rate:  142                          bpm
 Cardiac Activity:  Observed
 Presentation:      Breech
 Placenta:          Posterior, above cervical
                    os
 P. Cord            Previously Visualized
 Insertion:

 Amniotic Fluid
 AFI FV:      Subjectively within normal limits
                                             Larg Pckt:     4.5  cm
Biometry

 BPD:     53.3  mm     G. Age:  22w 1d                CI:         72.4   70 - 86
 OFD:     73.6  mm                                    FL/HC:      19.9   18.7 -

 HC:     205.5  mm     G. Age:  22w 5d      < 3  %    HC/AC:      1.07   1.05 -

 AC:     192.6  mm     G. Age:  24w 0d       36  %    FL/BPD:     76.5   71 - 87
 FL:      40.8  mm     G. Age:  23w 2d       14  %    FL/AC:      21.2   20 - 24
 HUM:     41.1  mm     G. Age:  25w 0d       59  %

 Est. FW:     598  gm      1 lb 5 oz     37  %
Gestational Age

 LMP:           24w 1d        Date:  [DATE]                 EDD:   [DATE]
 U/S Today:     23w 0d                                        EDD:   [DATE]
 Best:          24w 1d     Det. By:  LMP  ([DATE])          EDD:   [DATE]
Anatomy

 Cranium:          Appears normal         Aortic Arch:      Previously seen
 Fetal Cavum:      Appears normal         Ductal Arch:      Previously seen
 Ventricles:       Appears normal         Diaphragm:        Appears normal
 Choroid Plexus:   Previously seen        Stomach:          Appears normal, left
                                                            sided
 Cerebellum:       Appears normal         Abdomen:          Appears normal
 Posterior Fossa:  Appears normal         Abdominal Wall:   Appears nml (cord
                                                            insert, abd wall)
 Nuchal Fold:      Previously seen        Cord Vessels:     Appears normal (3
                                                            vessel cord)
 Face:             Orbits and profile     Kidneys:          Appear normal
                   previously seen
 Lips:             Appears normal         Bladder:          Appears normal
 Heart:            Appears normal         Spine:            Previously seen
                   (4CH, axis, and
                   situs)
 RVOT:             Previously seen        Lower             Previously seen
                                          Extremities:
 LVOT:             Previously seen        Upper             Previously seen
                                          Extremities:

 Other:  Female gender. Heels and 5th digit previously seen.
Cervix Uterus Adnexa

 Cervical Length:    4.3      cm

 Cervix:       Normal appearance by transabdominal scan. Appears
               closed, without funnelling.
Impression

 SIUP at 24+1 weeks
 Normal interval anatomy; anatomic survey complete
 Normal amniotic fluid volume
 Appropriate interval growth with EFW at the 37th %tile
 TA views of cervix: normal length without funneling
 Posterior placenta; no previa
Recommendations

 Follow-up as clinically indicated

## 2015-03-09 ENCOUNTER — Encounter: Payer: Self-pay | Admitting: Obstetrics

## 2015-03-09 ENCOUNTER — Other Ambulatory Visit: Payer: Medicaid Other

## 2015-03-09 ENCOUNTER — Ambulatory Visit (INDEPENDENT_AMBULATORY_CARE_PROVIDER_SITE_OTHER): Payer: Medicaid Other | Admitting: Obstetrics

## 2015-03-09 VITALS — BP 115/74 | HR 101 | Temp 98.0°F | Wt 186.0 lb

## 2015-03-09 DIAGNOSIS — Z3482 Encounter for supervision of other normal pregnancy, second trimester: Secondary | ICD-10-CM

## 2015-03-09 LAB — POCT URINALYSIS DIPSTICK
BILIRUBIN UA: NEGATIVE
GLUCOSE UA: NEGATIVE
Ketones, UA: NEGATIVE
LEUKOCYTES UA: NEGATIVE
NITRITE UA: NEGATIVE
RBC UA: NEGATIVE
Spec Grav, UA: 1.015
UROBILINOGEN UA: NEGATIVE
pH, UA: 5.5

## 2015-03-09 LAB — CBC
HCT: 31.6 % — ABNORMAL LOW (ref 36.0–46.0)
Hemoglobin: 10.6 g/dL — ABNORMAL LOW (ref 12.0–15.0)
MCH: 28.2 pg (ref 26.0–34.0)
MCHC: 33.5 g/dL (ref 30.0–36.0)
MCV: 84 fL (ref 78.0–100.0)
MPV: 9.3 fL (ref 8.6–12.4)
Platelets: 284 10*3/uL (ref 150–400)
RBC: 3.76 MIL/uL — AB (ref 3.87–5.11)
RDW: 14.1 % (ref 11.5–15.5)
WBC: 11.4 10*3/uL — ABNORMAL HIGH (ref 4.0–10.5)

## 2015-03-09 NOTE — Progress Notes (Signed)
Subjective:    Marie Watkins is a 34 y.o. female being seen today for her obstetrical visit. She is at 4338w3d gestation. Patient reports: no complaints . Fetal movement: normal.  Problem List Items Addressed This Visit    None    Visit Diagnoses    Encounter for supervision of other normal pregnancy in second trimester    -  Primary    Relevant Orders    POCT urinalysis dipstick (Completed)      Patient Active Problem List   Diagnosis Date Noted  . [redacted] weeks gestation of pregnancy   . Evaluate anatomy not seen on prior sonogram   . H/O preterm delivery, currently pregnant   . Placenta previa antepartum in second trimester   . Placenta previa antepartum 01/18/2015   Objective:    BP 115/74 mmHg  Pulse 101  Temp(Src) 98 F (36.7 C)  Wt 186 lb (84.369 kg)  LMP 09/12/2014 FHT: 150 BPM  Uterine Size: size equals dates     Assessment:    Pregnancy @ 5938w3d    Plan:    OBGCT: ordered.  Labs, problem list reviewed and updated 2 hr GTT planned Follow up in 2 weeks.

## 2015-03-09 NOTE — Addendum Note (Signed)
Addended by: Henriette CombsHATTON, Mikhaila Roh L on: 03/09/2015 01:31 PM   Modules accepted: Orders

## 2015-03-10 LAB — HIV ANTIBODY (ROUTINE TESTING W REFLEX): HIV 1&2 Ab, 4th Generation: NONREACTIVE

## 2015-03-10 LAB — GLUCOSE TOLERANCE, 2 HOURS W/ 1HR
GLUCOSE, FASTING: 67 mg/dL — AB (ref 70–99)
Glucose, 1 hour: 86 mg/dL (ref 70–170)
Glucose, 2 hour: 83 mg/dL (ref 70–139)

## 2015-03-10 LAB — RPR

## 2015-03-23 ENCOUNTER — Ambulatory Visit (INDEPENDENT_AMBULATORY_CARE_PROVIDER_SITE_OTHER): Payer: Medicaid Other | Admitting: Obstetrics

## 2015-03-23 ENCOUNTER — Encounter: Payer: Self-pay | Admitting: Obstetrics

## 2015-03-23 VITALS — BP 129/81 | HR 108 | Temp 97.9°F | Wt 187.0 lb

## 2015-03-23 DIAGNOSIS — Z3482 Encounter for supervision of other normal pregnancy, second trimester: Secondary | ICD-10-CM

## 2015-03-23 DIAGNOSIS — B3731 Acute candidiasis of vulva and vagina: Secondary | ICD-10-CM

## 2015-03-23 DIAGNOSIS — B373 Candidiasis of vulva and vagina: Secondary | ICD-10-CM

## 2015-03-23 LAB — POCT URINALYSIS DIPSTICK
Bilirubin, UA: NEGATIVE
Blood, UA: NEGATIVE
Glucose, UA: NEGATIVE
Ketones, UA: NEGATIVE
Leukocytes, UA: NEGATIVE
Nitrite, UA: NEGATIVE
PH UA: 5.5
PROTEIN UA: NEGATIVE
SPEC GRAV UA: 1.015
Urobilinogen, UA: NEGATIVE

## 2015-03-23 MED ORDER — RANITIDINE HCL 150 MG PO TABS: 150.0000 mg | ORAL_TABLET | Freq: Two times a day (BID) | ORAL | Status: DC

## 2015-03-23 MED ORDER — TERCONAZOLE 0.4 % VA CREA: 1.0000 | TOPICAL_CREAM | Freq: Every day | VAGINAL | Status: DC

## 2015-03-23 NOTE — Progress Notes (Signed)
Subjective:    Netta Cedarsameka N Buday is a 34 y.o. female being seen today for her obstetrical visit. She is at 4864w3d gestation. Patient reports: heartburn and vaginal itching . Fetal movement: normal.  Problem List Items Addressed This Visit    None    Visit Diagnoses    Encounter for supervision of other normal pregnancy in second trimester    -  Primary    Relevant Orders    POCT urinalysis dipstick (Completed)    Candida vaginitis        Relevant Medications    Terconazole (TERAZOL 7) 0.4% Vaginal cream      Patient Active Problem List   Diagnosis Date Noted  . [redacted] weeks gestation of pregnancy   . Evaluate anatomy not seen on prior sonogram   . H/O preterm delivery, currently pregnant   . Placenta previa antepartum in second trimester   . Placenta previa antepartum 01/18/2015   Objective:    BP 129/81 mmHg  Pulse 108  Temp(Src) 97.9 F (36.6 C)  Wt 187 lb (84.823 kg)  LMP 09/12/2014 FHT: 150 BPM  Uterine Size: size equals dates     Assessment:    Pregnancy @ 4064w3d    Plan:    OBGCT: discussed.  Labs, problem list reviewed and updated 2 hr GTT planned Follow up in 2 weeks.

## 2015-04-06 ENCOUNTER — Encounter: Payer: Medicaid Other | Admitting: Obstetrics

## 2015-04-06 ENCOUNTER — Telehealth: Payer: Self-pay | Admitting: *Deleted

## 2015-04-06 NOTE — Telephone Encounter (Signed)
Patient states she got a wrist brace for her L wrist- carpel tunnel pain- but now her R wrist is hurting. Do we have any advise for how to decrease the pain. 10:50 LM on VM to CB

## 2015-04-10 ENCOUNTER — Telehealth: Payer: Self-pay | Admitting: *Deleted

## 2015-04-10 NOTE — Telephone Encounter (Signed)
Patient states she is now having pain in her right wrist. Patient has a brace for her left hand. Patient advised we could get her a brace for her right hand or we could try some PT. Patient will be in the office on 04-13-15 and will get rx for brace then.

## 2015-04-13 ENCOUNTER — Encounter: Payer: Self-pay | Admitting: Obstetrics

## 2015-04-13 ENCOUNTER — Ambulatory Visit (INDEPENDENT_AMBULATORY_CARE_PROVIDER_SITE_OTHER): Payer: Medicaid Other | Admitting: Obstetrics

## 2015-04-13 VITALS — BP 124/68 | HR 109 | Temp 98.6°F | Wt 193.0 lb

## 2015-04-13 DIAGNOSIS — Z3483 Encounter for supervision of other normal pregnancy, third trimester: Secondary | ICD-10-CM

## 2015-04-13 LAB — POCT URINALYSIS DIPSTICK
GLUCOSE UA: NEGATIVE
Ketones, UA: NEGATIVE
Leukocytes, UA: NEGATIVE
NITRITE UA: NEGATIVE
PROTEIN UA: NEGATIVE
RBC UA: NEGATIVE
Spec Grav, UA: 1.01
pH, UA: 5

## 2015-04-13 NOTE — Progress Notes (Signed)
Patient reports she needs a brace for her R wrist for her carpel tunnel

## 2015-04-13 NOTE — Progress Notes (Signed)
Subjective:    Marie Watkins is a 34 y.o. female being seen today for her obstetrical visit. She is at 7665w3d gestation. Patient reports no complaints. Fetal movement: normal.  Problem List Items Addressed This Visit    None    Visit Diagnoses    Encounter for supervision of other normal pregnancy in third trimester    -  Primary    Relevant Orders    POCT urinalysis dipstick (Completed)      Patient Active Problem List   Diagnosis Date Noted  . [redacted] weeks gestation of pregnancy   . Evaluate anatomy not seen on prior sonogram   . H/O preterm delivery, currently pregnant   . Placenta previa antepartum in second trimester   . Placenta previa antepartum 01/18/2015   Objective:    BP 124/68 mmHg  Pulse 109  Temp(Src) 98.6 F (37 C)  Wt 193 lb (87.544 kg)  LMP 09/12/2014 FHT:  150 BPM  Uterine Size: size equals dates  Presentation: unsure     Assessment:    Pregnancy @ 1165w3d weeks   Plan:     labs reviewed, problem list updated Consent signed. GBS sent TDAP offered  Rhogam given for RH negative Pediatrician: discussed. Infant feeding: plans to breastfeed. Maternity leave: discussed. Cigarette smoking: former smoker. Orders Placed This Encounter  Procedures  . POCT urinalysis dipstick   No orders of the defined types were placed in this encounter.   Follow up in 2 Weeks.

## 2015-04-18 NOTE — Telephone Encounter (Signed)
Patient in office 4/28

## 2015-04-27 ENCOUNTER — Ambulatory Visit (INDEPENDENT_AMBULATORY_CARE_PROVIDER_SITE_OTHER): Payer: Medicaid Other | Admitting: Obstetrics

## 2015-04-27 VITALS — BP 120/71 | HR 93 | Temp 98.3°F | Wt 193.0 lb

## 2015-04-27 DIAGNOSIS — Z3483 Encounter for supervision of other normal pregnancy, third trimester: Secondary | ICD-10-CM

## 2015-04-27 DIAGNOSIS — K5900 Constipation, unspecified: Secondary | ICD-10-CM

## 2015-04-27 DIAGNOSIS — O99613 Diseases of the digestive system complicating pregnancy, third trimester: Secondary | ICD-10-CM

## 2015-04-27 LAB — POCT URINALYSIS DIPSTICK
Bilirubin, UA: NEGATIVE
Blood, UA: NEGATIVE
GLUCOSE UA: NEGATIVE
Ketones, UA: NEGATIVE
NITRITE UA: NEGATIVE
UROBILINOGEN UA: NEGATIVE
pH, UA: 7

## 2015-04-27 MED ORDER — MAGNESIUM HYDROXIDE 400 MG/5ML PO SUSP: 30.0000 mL | Freq: Every day | ORAL | Status: DC | PRN

## 2015-04-27 MED ORDER — DOCUSATE SODIUM 100 MG PO CAPS: 100.0000 mg | ORAL_CAPSULE | Freq: Two times a day (BID) | ORAL | Status: DC

## 2015-04-28 ENCOUNTER — Encounter: Payer: Self-pay | Admitting: Obstetrics

## 2015-04-28 NOTE — Progress Notes (Signed)
Subjective:    Marie Watkins is a 34 y.o. female being seen today for her obstetrical visit. She is at 709w4d gestation. Patient reports no complaints. Fetal movement: normal.  Problem List Items Addressed This Visit    None    Visit Diagnoses    Encounter for supervision of other normal pregnancy in third trimester    -  Primary    Relevant Orders    POCT urinalysis dipstick (Completed)    Constipation during pregnancy in third trimester        Relevant Medications    docusate sodium (COLACE) 100 MG capsule    magnesium hydroxide (MILK OF MAGNESIA) 400 MG/5ML suspension      Patient Active Problem List   Diagnosis Date Noted  . [redacted] weeks gestation of pregnancy   . Evaluate anatomy not seen on prior sonogram   . H/O preterm delivery, currently pregnant   . Placenta previa antepartum in second trimester   . Placenta previa antepartum 01/18/2015   Objective:    BP 120/71 mmHg  Pulse 93  Temp(Src) 98.3 F (36.8 C)  Wt 193 lb (87.544 kg)  LMP 09/12/2014 FHT:  150 BPM  Uterine Size: size equals dates  Presentation: unsure     Assessment:    Pregnancy @ 689w4d weeks   Plan:     labs reviewed, problem list updated Consent signed. GBS sent TDAP offered  Rhogam given for RH negative Pediatrician: discussed. Infant feeding: plans to breastfeed. Maternity leave: discussed. Cigarette smoking: former smoker. Orders Placed This Encounter  Procedures  . POCT urinalysis dipstick   Meds ordered this encounter  Medications  . docusate sodium (COLACE) 100 MG capsule    Sig: Take 1 capsule (100 mg total) by mouth 2 (two) times daily.    Dispense:  60 capsule    Refill:  11  . magnesium hydroxide (MILK OF MAGNESIA) 400 MG/5ML suspension    Sig: Take 30 mLs by mouth daily as needed for mild constipation or moderate constipation.    Dispense:  360 mL    Refill:  prn   Follow up in 2 Weeks.

## 2015-05-16 ENCOUNTER — Ambulatory Visit (INDEPENDENT_AMBULATORY_CARE_PROVIDER_SITE_OTHER): Payer: Medicaid Other | Admitting: Obstetrics

## 2015-05-16 VITALS — BP 121/74 | HR 89 | Temp 96.8°F | Wt 193.0 lb

## 2015-05-16 DIAGNOSIS — Z3483 Encounter for supervision of other normal pregnancy, third trimester: Secondary | ICD-10-CM

## 2015-05-16 LAB — POCT URINALYSIS DIPSTICK
Bilirubin, UA: NEGATIVE
Blood, UA: NEGATIVE
GLUCOSE UA: NEGATIVE
Ketones, UA: NEGATIVE
NITRITE UA: NEGATIVE
Spec Grav, UA: 1.015
Urobilinogen, UA: NEGATIVE
pH, UA: 6.5

## 2015-05-16 MED ORDER — DESLORATADINE 5 MG PO TABS: 5.0000 mg | ORAL_TABLET | Freq: Every day | ORAL | Status: DC

## 2015-05-17 ENCOUNTER — Other Ambulatory Visit: Payer: Self-pay | Admitting: *Deleted

## 2015-05-17 ENCOUNTER — Encounter: Payer: Self-pay | Admitting: Obstetrics

## 2015-05-17 MED ORDER — LORATADINE 10 MG PO TABS: 10.0000 mg | ORAL_TABLET | Freq: Every day | ORAL | Status: DC

## 2015-05-17 NOTE — Progress Notes (Signed)
Rx change due to insurance

## 2015-05-17 NOTE — Progress Notes (Signed)
Subjective:    Marie Watkins is a 34 y.o. female being seen today for her obstetrical visit. She is at 6563w2d gestation. Patient reports no complaints. Fetal movement: normal.  Problem List Items Addressed This Visit    None    Visit Diagnoses    Encounter for supervision of other normal pregnancy in third trimester    -  Primary    Relevant Orders    POCT urinalysis dipstick (Completed)      Patient Active Problem List   Diagnosis Date Noted  . [redacted] weeks gestation of pregnancy   . Evaluate anatomy not seen on prior sonogram   . H/O preterm delivery, currently pregnant   . Placenta previa antepartum in second trimester   . Placenta previa antepartum 01/18/2015   Objective:    BP 121/74 mmHg  Pulse 89  Temp(Src) 96.8 F (36 C)  Wt 193 lb (87.544 kg)  LMP 09/12/2014 FHT:  150 BPM  Uterine Size: size equals dates  Presentation: unsure     Assessment:    Pregnancy @ 1263w2d weeks   Plan:     labs reviewed, problem list updated Consent signed. GBS sent TDAP offered  Rhogam given for RH negative Pediatrician: discussed. Infant feeding: plans to breastfeed. Maternity leave: discussed. Cigarette smoking: former smoker. Orders Placed This Encounter  Procedures  . POCT urinalysis dipstick   Meds ordered this encounter  Medications  . desloratadine (CLARINEX) 5 MG tablet    Sig: Take 1 tablet (5 mg total) by mouth daily.    Dispense:  30 tablet    Refill:  11   Follow up in 1 Week.

## 2015-05-29 ENCOUNTER — Ambulatory Visit (INDEPENDENT_AMBULATORY_CARE_PROVIDER_SITE_OTHER): Payer: Medicaid Other | Admitting: Obstetrics

## 2015-05-29 ENCOUNTER — Encounter: Payer: Self-pay | Admitting: Obstetrics

## 2015-05-29 VITALS — BP 116/71 | HR 103 | Temp 98.3°F | Wt 189.0 lb

## 2015-05-29 DIAGNOSIS — Z3483 Encounter for supervision of other normal pregnancy, third trimester: Secondary | ICD-10-CM

## 2015-05-29 LAB — POCT URINALYSIS DIPSTICK
BILIRUBIN UA: NEGATIVE
GLUCOSE UA: NEGATIVE
Ketones, UA: NEGATIVE
Nitrite, UA: NEGATIVE
RBC UA: NEGATIVE
Spec Grav, UA: 1.015
Urobilinogen, UA: NEGATIVE
pH, UA: 6

## 2015-05-29 NOTE — Progress Notes (Signed)
Subjective:    Marie Watkins is a 34 y.o. female being seen today for her obstetrical visit. She is at [redacted]w[redacted]d gestation. Patient reports occasional contractions. Fetal movement: normal.  Problem List Items Addressed This Visit    None    Visit Diagnoses    Encounter for supervision of other normal pregnancy in third trimester    -  Primary    Relevant Orders    POCT urinalysis dipstick (Completed)      Patient Active Problem List   Diagnosis Date Noted  . [redacted] weeks gestation of pregnancy   . Evaluate anatomy not seen on prior sonogram   . H/O preterm delivery, currently pregnant   . Placenta previa antepartum in second trimester   . Placenta previa antepartum 01/18/2015    Objective:    BP 116/71 mmHg  Pulse 103  Temp(Src) 98.3 F (36.8 C)  Wt 189 lb (85.73 kg)  LMP 09/12/2014 FHT: 150 BPM  Uterine Size: size equals dates  Presentations: cephalic  Pelvic Exam: Deferred    Assessment:    Pregnancy @ [redacted]w[redacted]d weeks   Plan:   Plans for delivery: Vaginal anticipated; labs reviewed; problem list updated Counseling: Consent signed. Infant feeding: plans to breastfeed. Cigarette smoking: former smoker. L&D discussion: symptoms of labor, discussed when to call, discussed what number to call, anesthetic/analgesic options reviewed and delivering clinician:  plans Physician. Postpartum supports and preparation: circumcision discussed and contraception plans discussed.  Follow up in 1 Week.

## 2015-06-05 ENCOUNTER — Encounter: Payer: Self-pay | Admitting: Obstetrics

## 2015-06-05 ENCOUNTER — Ambulatory Visit (INDEPENDENT_AMBULATORY_CARE_PROVIDER_SITE_OTHER): Payer: Medicaid Other | Admitting: Obstetrics

## 2015-06-05 VITALS — BP 130/69 | HR 89 | Temp 98.4°F | Wt 191.0 lb

## 2015-06-05 DIAGNOSIS — Z3483 Encounter for supervision of other normal pregnancy, third trimester: Secondary | ICD-10-CM

## 2015-06-05 LAB — POCT URINALYSIS DIPSTICK
Bilirubin, UA: NEGATIVE
Blood, UA: NEGATIVE
Glucose, UA: NEGATIVE
Ketones, UA: NEGATIVE
Nitrite, UA: NEGATIVE
PH UA: 6
PROTEIN UA: NEGATIVE
Spec Grav, UA: 1.01
Urobilinogen, UA: NEGATIVE

## 2015-06-05 NOTE — Progress Notes (Signed)
Subjective:    Marie Watkins is a 34 y.o. female being seen today for her obstetrical visit. She is at [redacted]w[redacted]d gestation. Patient reports no complaints. Fetal movement: normal.  Problem List Items Addressed This Visit    None    Visit Diagnoses    Encounter for supervision of other normal pregnancy in third trimester    -  Primary    Relevant Orders    POCT urinalysis dipstick      Patient Active Problem List   Diagnosis Date Noted  . [redacted] weeks gestation of pregnancy   . Evaluate anatomy not seen on prior sonogram   . H/O preterm delivery, currently pregnant   . Placenta previa antepartum in second trimester   . Placenta previa antepartum 01/18/2015    Objective:    BP 130/69 mmHg  Pulse 89  Temp(Src) 98.4 F (36.9 C)  Wt 191 lb (86.637 kg)  LMP 09/12/2014 FHT: 150 BPM  Uterine Size: Size equal dates  Presentations: unsure  Pelvic Exam: Deferred    Assessment:    Pregnancy @ [redacted]w[redacted]d weeks   Plan:   Plans for delivery: VBAC planned; labs reviewed; problem list updated Counseling: Consent signed. Infant feeding: plans to breastfeed. Cigarette smoking: former smoker. L&D discussion: symptoms of labor, discussed when to call, discussed what number to call, anesthetic/analgesic options reviewed and delivering clinician:  plans no preference. Postpartum supports and preparation: circumcision discussed and contraception plans discussed.  Follow up in 1 Week.

## 2015-06-09 ENCOUNTER — Other Ambulatory Visit: Payer: Self-pay | Admitting: Certified Nurse Midwife

## 2015-06-10 ENCOUNTER — Inpatient Hospital Stay (HOSPITAL_COMMUNITY): Payer: Medicaid Other | Admitting: Anesthesiology

## 2015-06-10 ENCOUNTER — Inpatient Hospital Stay (HOSPITAL_COMMUNITY)
Admission: AD | Admit: 2015-06-10 | Discharge: 2015-06-12 | DRG: 775 | Disposition: A | Discharge status: Home / Self Care | Payer: Medicaid Other | Source: Ambulatory Visit | Attending: Obstetrics | Admitting: Obstetrics

## 2015-06-10 ENCOUNTER — Encounter (HOSPITAL_COMMUNITY): Payer: Self-pay | Admitting: *Deleted

## 2015-06-10 DIAGNOSIS — Z3A38 38 weeks gestation of pregnancy: Secondary | ICD-10-CM | POA: Diagnosis present

## 2015-06-10 DIAGNOSIS — IMO0001 Reserved for inherently not codable concepts without codable children: Secondary | ICD-10-CM

## 2015-06-10 DIAGNOSIS — O99824 Streptococcus B carrier state complicating childbirth: Secondary | ICD-10-CM | POA: Diagnosis present

## 2015-06-10 DIAGNOSIS — Z3483 Encounter for supervision of other normal pregnancy, third trimester: Secondary | ICD-10-CM | POA: Diagnosis present

## 2015-06-10 LAB — RPR: RPR Ser Ql: NONREACTIVE

## 2015-06-10 LAB — CBC
HCT: 33.3 % — ABNORMAL LOW (ref 36.0–46.0)
HEMOGLOBIN: 11 g/dL — AB (ref 12.0–15.0)
MCH: 26.7 pg (ref 26.0–34.0)
MCHC: 33 g/dL (ref 30.0–36.0)
MCV: 80.8 fL (ref 78.0–100.0)
Platelets: 293 10*3/uL (ref 150–400)
RBC: 4.12 MIL/uL (ref 3.87–5.11)
RDW: 14.2 % (ref 11.5–15.5)
WBC: 10.8 10*3/uL — AB (ref 4.0–10.5)

## 2015-06-10 LAB — TYPE AND SCREEN
ABO/RH(D): O POS
Antibody Screen: NEGATIVE

## 2015-06-10 MED ORDER — TETANUS-DIPHTH-ACELL PERTUSSIS 5-2.5-18.5 LF-MCG/0.5 IM SUSP: 0.5000 mL | Freq: Once | INTRAMUSCULAR | Status: DC

## 2015-06-10 MED ORDER — CITRIC ACID-SODIUM CITRATE 334-500 MG/5ML PO SOLN
30.0000 mL | ORAL | Status: DC | PRN
Start: 2015-06-10 — End: 2015-06-10

## 2015-06-10 MED ORDER — ACETAMINOPHEN 325 MG PO TABS: 650.0000 mg | ORAL_TABLET | ORAL | Status: DC | PRN

## 2015-06-10 MED ORDER — PHENYLEPHRINE 40 MCG/ML (10ML) SYRINGE FOR IV PUSH (FOR BLOOD PRESSURE SUPPORT)
80.0000 ug | PREFILLED_SYRINGE | INTRAVENOUS | Status: DC | PRN
  Filled 2015-06-10: qty 2
  Filled 2015-06-10: qty 20

## 2015-06-10 MED ORDER — OXYCODONE-ACETAMINOPHEN 5-325 MG PO TABS
1.0000 | ORAL_TABLET | ORAL | Status: DC | PRN
  Administered 2015-06-10: 1 via ORAL

## 2015-06-10 MED ORDER — FENTANYL 2.5 MCG/ML BUPIVACAINE 1/10 % EPIDURAL INFUSION (WH - ANES)
14.0000 mL/h | INTRAMUSCULAR | Status: DC | PRN
  Administered 2015-06-10 (×2): 14 mL/h via EPIDURAL
  Filled 2015-06-10: qty 125

## 2015-06-10 MED ORDER — ONDANSETRON HCL 4 MG/2ML IJ SOLN: 4.0000 mg | INTRAMUSCULAR | Status: DC | PRN

## 2015-06-10 MED ORDER — FLEET ENEMA 7-19 GM/118ML RE ENEM: 1.0000 | ENEMA | RECTAL | Status: DC | PRN

## 2015-06-10 MED ORDER — LANOLIN HYDROUS EX OINT: TOPICAL_OINTMENT | CUTANEOUS | Status: DC | PRN

## 2015-06-10 MED ORDER — IBUPROFEN 600 MG PO TABS
600.0000 mg | ORAL_TABLET | Freq: Four times a day (QID) | ORAL | Status: DC
Start: 2015-06-10 — End: 2015-06-12
  Administered 2015-06-10 – 2015-06-12 (×10): 600 mg via ORAL
  Filled 2015-06-10 (×10): qty 1

## 2015-06-10 MED ORDER — OXYCODONE-ACETAMINOPHEN 5-325 MG PO TABS
2.0000 | ORAL_TABLET | ORAL | Status: DC | PRN
  Administered 2015-06-10: 2 via ORAL
  Filled 2015-06-10: qty 2

## 2015-06-10 MED ORDER — DIBUCAINE 1 % RE OINT
1.0000 "application " | TOPICAL_OINTMENT | RECTAL | Status: DC | PRN
  Administered 2015-06-11: 1 via RECTAL
  Filled 2015-06-10: qty 28

## 2015-06-10 MED ORDER — LACTATED RINGERS IV SOLN: 500.0000 mL | INTRAVENOUS | Status: DC | PRN

## 2015-06-10 MED ORDER — WITCH HAZEL-GLYCERIN EX PADS
1.0000 "application " | MEDICATED_PAD | CUTANEOUS | Status: DC | PRN
  Administered 2015-06-11: 1 via TOPICAL

## 2015-06-10 MED ORDER — LIDOCAINE HCL (PF) 1 % IJ SOLN
INTRAMUSCULAR | Status: DC | PRN
  Administered 2015-06-10: 3 mL
  Administered 2015-06-10 (×2): 5 mL

## 2015-06-10 MED ORDER — FENTANYL 2.5 MCG/ML BUPIVACAINE 1/10 % EPIDURAL INFUSION (WH - ANES): 14.0000 mL/h | INTRAMUSCULAR | Status: DC | PRN

## 2015-06-10 MED ORDER — ONDANSETRON HCL 4 MG PO TABS: 4.0000 mg | ORAL_TABLET | ORAL | Status: DC | PRN

## 2015-06-10 MED ORDER — LACTATED RINGERS IV SOLN: INTRAVENOUS | Status: DC

## 2015-06-10 MED ORDER — DIPHENHYDRAMINE HCL 25 MG PO CAPS: 25.0000 mg | ORAL_CAPSULE | Freq: Four times a day (QID) | ORAL | Status: DC | PRN

## 2015-06-10 MED ORDER — DIPHENHYDRAMINE HCL 50 MG/ML IJ SOLN: 12.5000 mg | INTRAMUSCULAR | Status: DC | PRN

## 2015-06-10 MED ORDER — EPHEDRINE 5 MG/ML INJ: 10.0000 mg | INTRAVENOUS | Status: DC | PRN

## 2015-06-10 MED ORDER — OXYCODONE-ACETAMINOPHEN 5-325 MG PO TABS: 2.0000 | ORAL_TABLET | ORAL | Status: DC | PRN

## 2015-06-10 MED ORDER — PHENYLEPHRINE 40 MCG/ML (10ML) SYRINGE FOR IV PUSH (FOR BLOOD PRESSURE SUPPORT): 80.0000 ug | PREFILLED_SYRINGE | INTRAVENOUS | Status: DC | PRN

## 2015-06-10 MED ORDER — ONDANSETRON HCL 4 MG/2ML IJ SOLN: 4.0000 mg | Freq: Four times a day (QID) | INTRAMUSCULAR | Status: DC | PRN

## 2015-06-10 MED ORDER — SIMETHICONE 80 MG PO CHEW: 80.0000 mg | CHEWABLE_TABLET | ORAL | Status: DC | PRN

## 2015-06-10 MED ORDER — LIDOCAINE HCL (PF) 1 % IJ SOLN
30.0000 mL | INTRAMUSCULAR | Status: DC | PRN
  Filled 2015-06-10: qty 30

## 2015-06-10 MED ORDER — OXYTOCIN 40 UNITS IN LACTATED RINGERS INFUSION - SIMPLE MED
62.5000 mL/h | INTRAVENOUS | Status: DC
  Filled 2015-06-10: qty 1000

## 2015-06-10 MED ORDER — BUTORPHANOL TARTRATE 1 MG/ML IJ SOLN: 1.0000 mg | INTRAMUSCULAR | Status: DC | PRN

## 2015-06-10 MED ORDER — OXYTOCIN BOLUS FROM INFUSION
500.0000 mL | INTRAVENOUS | Status: DC
  Administered 2015-06-10: 500 mL via INTRAVENOUS

## 2015-06-10 MED ORDER — SODIUM CHLORIDE 0.9 % IV SOLN
2.0000 g | Freq: Once | INTRAVENOUS | Status: AC
  Administered 2015-06-10: 2 g via INTRAVENOUS
  Filled 2015-06-10: qty 2000

## 2015-06-10 MED ORDER — ZOLPIDEM TARTRATE 5 MG PO TABS: 5.0000 mg | ORAL_TABLET | Freq: Every evening | ORAL | Status: DC | PRN

## 2015-06-10 MED ORDER — FERROUS SULFATE 325 (65 FE) MG PO TABS
325.0000 mg | ORAL_TABLET | Freq: Two times a day (BID) | ORAL | Status: DC
  Administered 2015-06-10 – 2015-06-12 (×5): 325 mg via ORAL
  Filled 2015-06-10 (×6): qty 1

## 2015-06-10 MED ORDER — BENZOCAINE-MENTHOL 20-0.5 % EX AERO
1.0000 "application " | INHALATION_SPRAY | CUTANEOUS | Status: DC | PRN
  Administered 2015-06-10: 1 via TOPICAL
  Filled 2015-06-10: qty 56

## 2015-06-10 MED ORDER — SENNOSIDES-DOCUSATE SODIUM 8.6-50 MG PO TABS
2.0000 | ORAL_TABLET | ORAL | Status: DC
  Administered 2015-06-10 – 2015-06-12 (×2): 2 via ORAL
  Filled 2015-06-10 (×2): qty 2

## 2015-06-10 MED ORDER — OXYCODONE-ACETAMINOPHEN 5-325 MG PO TABS
1.0000 | ORAL_TABLET | ORAL | Status: DC | PRN
  Filled 2015-06-10: qty 1

## 2015-06-10 MED ORDER — PRENATAL MULTIVITAMIN CH
1.0000 | ORAL_TABLET | Freq: Every day | ORAL | Status: DC
  Administered 2015-06-10 – 2015-06-12 (×3): 1 via ORAL
  Filled 2015-06-10 (×3): qty 1

## 2015-06-10 MED ORDER — EPHEDRINE 5 MG/ML INJ
10.0000 mg | INTRAVENOUS | Status: DC | PRN
  Filled 2015-06-10: qty 2

## 2015-06-10 NOTE — Lactation Note (Signed)
This note was copied from the chart of Marie Adalei Whitcomb. Lactation Consultation Note: Experienced BF mom. Baby under phototherapy. Mom reports baby has latched on well. Mom asking about pumping to increase supply. DEBP set up for mom and she is pumping as I left room. BF brochure given with resources for support after DC. No questions at present.   Patient Name: Marie Watkins [id number] Date: 06/10/2015 Reason for consult: Initial assessment;Infant < 6lbs (+ DAT- under phototherapy)   Maternal Data Formula Feeding for Exclusion: Yes Reason for exclusion: Mother's choice to formula and breast feed on admission Does the patient have breastfeeding experience prior to this delivery?: Yes  Feeding Feeding Type: Breast Fed Length of feed: 20 min  LATCH Score/Interventions Latch: Grasps breast easily, tongue down, lips flanged, rhythmical sucking.  Audible Swallowing: A few with stimulation  Type of Nipple: Everted at rest and after stimulation  Comfort (Breast/Nipple): Soft / non-tender     Hold (Positioning): Assistance needed to correctly position infant at breast and maintain latch. Intervention(s): Breastfeeding basics reviewed  LATCH Score: 8  Lactation Tools Discussed/Used Pump Review: Setup, frequency, and cleaning Initiated by:: DW Date initiated:: 06/03/15   Consult Status Consult Status: Follow-up Date: 06/11/15 Follow-up type: In-patient    Pamelia Hoit 06/10/2015, 3:41 PM

## 2015-06-10 NOTE — Anesthesia Preprocedure Evaluation (Signed)
Anesthesia Evaluation  Patient identified by MRN, date of birth, ID band Patient awake    Reviewed: Allergy & Precautions, H&P , NPO status , Patient's Chart, lab work & pertinent test results  History of Anesthesia Complications Negative for: history of anesthetic complications  Airway Mallampati: II  TM Distance: >3 FB Neck ROM: full    Dental no notable dental hx. (+) Teeth Intact   Pulmonary neg pulmonary ROS, former smoker,  breath sounds clear to auscultation  Pulmonary exam normal       Cardiovascular negative cardio ROS Normal cardiovascular examRhythm:regular Rate:Normal     Neuro/Psych negative neurological ROS  negative psych ROS   GI/Hepatic negative GI ROS, Neg liver ROS,   Endo/Other  negative endocrine ROS  Renal/GU negative Renal ROS  negative genitourinary   Musculoskeletal   Abdominal   Peds  Hematology negative hematology ROS (+)   Anesthesia Other Findings   Reproductive/Obstetrics (+) Pregnancy                             Anesthesia Physical Anesthesia Plan  ASA: II  Anesthesia Plan: Epidural   Post-op Pain Management:    Induction:   Airway Management Planned:   Additional Equipment:   Intra-op Plan:   Post-operative Plan:   Informed Consent: I have reviewed the patients History and Physical, chart, labs and discussed the procedure including the risks, benefits and alternatives for the proposed anesthesia with the patient or authorized representative who has indicated his/her understanding and acceptance.     Plan Discussed with:   Anesthesia Plan Comments:         Anesthesia Quick Evaluation  

## 2015-06-10 NOTE — Anesthesia Postprocedure Evaluation (Signed)
Anesthesia Post Note  Patient: Marie Watkins  Procedure(s) Performed: * No procedures listed *  Anesthesia type: Epidural  Patient location: Mother/Baby  Post pain: Pain level controlled  Post assessment: Post-op Vital signs reviewed  Last Vitals:  Filed Vitals:   06/10/15 0757  BP: 124/80  Pulse: 71  Temp: 37 C  Resp: 18    Post vital signs: Reviewed  Level of consciousness: awake  Complications: No apparent anesthesia complications

## 2015-06-10 NOTE — Anesthesia Procedure Notes (Signed)
Epidural Patient location during procedure: OB  Staffing Anesthesiologist: Inesha Sow Performed by: anesthesiologist   Preanesthetic Checklist Completed: patient identified, site marked, surgical consent, pre-op evaluation, timeout performed, IV checked, risks and benefits discussed and monitors and equipment checked  Epidural Patient position: sitting Prep: DuraPrep Patient monitoring: heart rate, continuous pulse ox and blood pressure Approach: right paramedian Location: L3-L4 Injection technique: LOR saline  Needle:  Needle type: Tuohy  Needle gauge: 17 G Needle length: 9 cm and 9 Needle insertion depth: 5 cm Catheter type: closed end flexible Catheter size: 20 Guage Catheter at skin depth: 10 cm Test dose: negative  Assessment Events: blood not aspirated, injection not painful, no injection resistance, negative IV test and no paresthesia  Additional Notes Patient identified. Risks/Benefits/Options discussed with patient including but not limited to bleeding, infection, nerve damage, paralysis, failed block, incomplete pain control, headache, blood pressure changes, nausea, vomiting, reactions to medication both or allergic, itching and postpartum back pain. Confirmed with bedside nurse the patient's most recent platelet count. Confirmed with patient that they are not currently taking any anticoagulation, have any bleeding history or any family history of bleeding disorders. Patient expressed understanding and wished to proceed. All questions were answered. Sterile technique was used throughout the entire procedure. Please see nursing notes for vital signs. Test dose was given through epidural needle and negative prior to continuing to dose epidural or start infusion. Warning signs of high block given to the patient including shortness of breath, tingling/numbness in hands, complete motor block, or any concerning symptoms with instructions to call for help. Patient was given  instructions on fall risk and not to get out of bed. All questions and concerns addressed with instructions to call with any issues.   

## 2015-06-10 NOTE — H&P (Signed)
This is Dr. Francoise Ceo dictating the history and physical on  Marie Watkins she's a 34 year old gravida 7 para 3033 at 38 weeks and 5 days EDC 7 for positive GBS got ampicillin admitted 7 cm progressed rapidly had a normal vaginal delivery of a female Apgar 8 and 9 placenta spontaneous no episiotomy or lacerations Past medical history negative Past surgical history negative Social history negative System review negative Physical exam well-developed female post delivery HEENT negative Lungs clear to P&A Heart regular rhythm no murmurs or gallops Breasts negative Abdomen 20 week size uterus postpartum Pelvic as described above Extremities negative

## 2015-06-10 NOTE — MAU Note (Signed)
Ctx since 1am. Denies leaking or bleeding

## 2015-06-11 LAB — CBC
HCT: 30 % — ABNORMAL LOW (ref 36.0–46.0)
HEMOGLOBIN: 9.7 g/dL — AB (ref 12.0–15.0)
MCH: 26.6 pg (ref 26.0–34.0)
MCHC: 32.3 g/dL (ref 30.0–36.0)
MCV: 82.4 fL (ref 78.0–100.0)
PLATELETS: 243 10*3/uL (ref 150–400)
RBC: 3.64 MIL/uL — AB (ref 3.87–5.11)
RDW: 14.4 % (ref 11.5–15.5)
WBC: 11.3 10*3/uL — ABNORMAL HIGH (ref 4.0–10.5)

## 2015-06-11 NOTE — Progress Notes (Signed)
Patient ID: Marie Watkins, female   DOB: January 02, 1981, 34 y.o.   MRN: [id number] Postpartum day one Vital signs normal Blood pressure 117/57 respiration 18 pulse 68 Fundus firm Lochia moderate Doing well

## 2015-06-12 ENCOUNTER — Encounter: Payer: Medicaid Other | Admitting: Obstetrics

## 2015-06-12 MED ORDER — OXYCODONE-ACETAMINOPHEN 5-325 MG PO TABS: 2.0000 | ORAL_TABLET | ORAL | Status: DC | PRN

## 2015-06-12 MED ORDER — FE HEME POLYPEPTIDE-FOLIC ACID 12-1 MG PO TABS: 1.0000 | ORAL_TABLET | Freq: Every day | ORAL | Status: DC

## 2015-06-12 MED ORDER — IBUPROFEN 600 MG PO TABS: 600.0000 mg | ORAL_TABLET | Freq: Four times a day (QID) | ORAL | Status: DC | PRN

## 2015-06-12 NOTE — Discharge Summary (Signed)
Obstetric Discharge Summary Reason for Admission: onset of labor Prenatal Procedures: ultrasound Intrapartum Procedures: spontaneous vaginal delivery Postpartum Procedures: none Complications-Operative and Postpartum: none HEMOGLOBIN  Date Value Ref Range Status  06/11/2015 9.7* 12.0 - 15.0 g/dL Final   HCT  Date Value Ref Range Status  06/11/2015 30.0* 36.0 - 46.0 % Final    Physical Exam:  General: alert and no distress Lochia: appropriate Uterine Fundus: firm Incision: none DVT Evaluation: No evidence of DVT seen on physical exam.  Discharge Diagnoses: Term Pregnancy-delivered  Discharge Information: Date: 06/12/2015 Activity: pelvic rest Diet: routine Medications: PNV, Ibuprofen, Colace, Iron and Percocet Condition: stable Instructions: refer to practice specific booklet Discharge to: home Follow-up Information    Follow up with Luke Rigsbee A, MD In 2 weeks.   Specialty:  Obstetrics and Gynecology   Contact information:   [address] [place] [address] [phone]       Newborn Data: Live born female  Birth Weight: 5 lb 14 oz (2665 g) APGAR: 8, 9  Home with mother.  Marie Watkins A 06/12/2015, 7:55 AM

## 2015-06-12 NOTE — Lactation Note (Signed)
This note was copied from the chart of Marie Watkins. Lactation Consultation Note  Patient Name: Marie Orra Nolde [id number] Date: 06/12/2015 Reason for consult: Follow-up assessment;Infant < 6lbs   Follow-up consult at 55 hours old; Infant <6lbs with 5% weight loss; G5 mom with 6 months BF each previous child and experience with home photo therapy.  LS-8 by RN Infant bililevels at 12.4 in High Intermediate Risk zone.  Infant is being discharged home on photo therapy with follow-up for weight check and bili level check tomorrow with Peds. Infant has breastfed x3 (10 min) + formula x9 (10-35 min) in past 24 hours; voids-6 in 24 hours/ 7 life; stools-5 in 24 hours/ 12 life. Mom reports infant is breastfeeding well with no concerns, but has been giving bottles d/t increasing bili levels.   Mom has a DEBP in room and reports pumping.  Mom states her breasts are heavier and can tell her milk is increasing in volume, but did not state how much she was pumping or hand expressing. LC reviewed with mom supply and demand and encouraged mom to breastfeed first prior to giving bottles. Encouraged continued pumping at home to keep supply up while infant is receiving supplements after breastfeeding.  Encouraged using EBM as available. Mom has DEBP kit.  [facility] taught how to use double hand pump from kit and also gave a single hand pump for use after discharge.  All kit pieces removed from pump and placed with mom's discharge bags. Engorgement prevention discussed.   Patient teaching handout on "Formula preparation, handling, and storage" given and reviewed; reviewed appropriate supplementation amounts based on day of life. Pt does not have [place], but informed of hospital outpatient support and hospital support group.  Encouraged mom to call for questions if needed.       Maternal Data Has patient been taught Hand Expression?: Yes (mom reports knowing how to HE)  Feeding Feeding Type: Bottle Fed -  Formula Nipple Type: Slow - flow  LATCH Score/Interventions                      Lactation Tools Discussed/Used WIC Program: No Pump Review: Setup, frequency, and cleaning   Consult Status Consult Status: Complete    Merlene Laughter 06/12/2015, 10:57 AM

## 2015-06-12 NOTE — Progress Notes (Signed)
UR chart review completed.  

## 2015-06-12 NOTE — Progress Notes (Addendum)
Subjective: Postpartum Day 3 Patient reports tolerating PO, + flatus, + BM and no problems voiding.    Objective: Vital signs in last 24 hours: Temp:  [97.8 F (36.6 C)-98.5 F (36.9 C)] 98.5 F (36.9 C) (06/27 0541) Pulse Rate:  [64-67] 67 (06/27 0541) Resp:  [18] 18 (06/27 0541) BP: (116-122)/(67-69) 116/69 mmHg (06/27 0541)  Physical Exam:  General: alert and no distress Lochia: appropriate Uterine Fundus: firm Incision: none DVT Evaluation: No evidence of DVT seen on physical exam.   Recent Labs  06/10/15 0345 06/11/15 0730  HGB 11.0* 9.7*  HCT 33.3* 30.0*    Assessment/Plan: Status post NSVD.   Doing well. Anemia.  Clinically stable.  Iron Rx. Discharge home with standard precautions and return to clinic in 2 weeks.  HARPER,CHARLES A 06/12/2015, 7:46 AM

## 2015-06-16 ENCOUNTER — Other Ambulatory Visit: Payer: Self-pay | Admitting: Certified Nurse Midwife

## 2015-06-20 ENCOUNTER — Encounter: Payer: Self-pay | Admitting: Obstetrics

## 2015-06-28 ENCOUNTER — Encounter: Payer: Self-pay | Admitting: Obstetrics

## 2015-06-28 ENCOUNTER — Ambulatory Visit (INDEPENDENT_AMBULATORY_CARE_PROVIDER_SITE_OTHER): Payer: Medicaid Other | Admitting: Obstetrics

## 2015-06-28 VITALS — BP 125/87 | HR 65 | Temp 97.8°F | Wt 174.0 lb

## 2015-06-28 DIAGNOSIS — Z124 Encounter for screening for malignant neoplasm of cervix: Secondary | ICD-10-CM

## 2015-06-28 DIAGNOSIS — Z01419 Encounter for gynecological examination (general) (routine) without abnormal findings: Secondary | ICD-10-CM

## 2015-06-28 DIAGNOSIS — Z30014 Encounter for initial prescription of intrauterine contraceptive device: Secondary | ICD-10-CM

## 2015-06-28 NOTE — Progress Notes (Signed)
Subjective:     Marie Watkins is a 34 y.o. female who presents for a postpartum visit. She is 2 weeks postpartum following a spontaneous vaginal delivery. I have fully reviewed the prenatal and intrapartum course. The delivery was at 38.5 gestational weeks. Outcome: spontaneous vaginal delivery. Anesthesia: none. Postpartum course has been normal. Baby's course has been normal. Baby is feeding by breast. Bleeding thin lochia. Bowel function is normal. Bladder function is normal. Patient is not sexually active. Contraception method is abstinence. Postpartum depression screening: negative.  Tobacco, alcohol and substance abuse history reviewed.  Adult immunizations reviewed including TDAP, rubella and varicella.  The following portions of the patient's history were reviewed and updated as appropriate: allergies, current medications, past family history, past medical history, past social history, past surgical history and problem list.  Review of Systems A comprehensive review of systems was negative.   Objective:    BP 125/87 mmHg  Pulse 65  Temp(Src) 97.8 F (36.6 C)  Wt 174 lb (78.926 kg)    100% of 10 min visit spent on counseling and coordination of care.   Assessment:    2 weeks postpartum.  Doing well.  Contraceptive management.  Wants ParaGuard IUD.   Plan:    1. Contraception: IUD 2. ParaGuard Rx 3. Follow up in: 4 weeks or as needed.   Healthy lifestyle practices reviewed

## 2015-06-30 ENCOUNTER — Telehealth: Payer: Self-pay | Admitting: Obstetrics

## 2015-06-30 NOTE — Telephone Encounter (Signed)
06/30/2015 - Tried unsuccessfully to reach patient at phone listed on chart to advise her paperwork was ready for pick up. brm

## 2015-07-07 NOTE — Telephone Encounter (Signed)
07/07/2015 - no return call from patient °

## 2015-07-26 ENCOUNTER — Encounter: Payer: Self-pay | Admitting: Obstetrics

## 2015-07-26 ENCOUNTER — Ambulatory Visit (INDEPENDENT_AMBULATORY_CARE_PROVIDER_SITE_OTHER): Payer: Medicaid Other | Admitting: Obstetrics

## 2015-07-26 DIAGNOSIS — Z30014 Encounter for initial prescription of intrauterine contraceptive device: Secondary | ICD-10-CM

## 2015-07-26 DIAGNOSIS — Z113 Encounter for screening for infections with a predominantly sexual mode of transmission: Secondary | ICD-10-CM

## 2015-07-26 LAB — POCT URINE PREGNANCY: PREG TEST UR: NEGATIVE

## 2015-07-26 NOTE — Addendum Note (Signed)
Addended by: Coral Ceo A on: 07/26/2015 12:50 PM   Modules accepted: Orders

## 2015-07-26 NOTE — Progress Notes (Signed)
Subjective:     Marie Watkins is a 34 y.o. female who presents for a postpartum visit. She is 6 weeks postpartum following a spontaneous vaginal delivery. I have fully reviewed the prenatal and intrapartum course. The delivery was at 38.5 gestational weeks. Outcome: spontaneous vaginal delivery. Anesthesia: none. Postpartum course has been normal. Baby's course has been normal. Baby is feeding by bottle - Similac Advance. Bleeding no bleeding. Bowel function is normal. Bladder function is normal. Patient is sexually active. Contraception method is none. Postpartum depression screening: negative.  Tobacco, alcohol and substance abuse history reviewed.  Adult immunizations reviewed including TDAP, rubella and varicella.  The following portions of the patient's history were reviewed and updated as appropriate: allergies, current medications, past family history, past medical history, past social history, past surgical history and problem list.  Review of Systems A comprehensive review of systems was negative.   Objective:    BP 127/84 mmHg  Pulse 80  Temp(Src) 98.6 F (37 C)  Wt 171 lb (77.565 kg)  General:  alert and no distress   Breasts:  inspection negative, no nipple discharge or bleeding, no masses or nodularity palpable  Lungs: clear to auscultation bilaterally  Heart:  regular rate and rhythm, S1, S2 normal, no murmur, click, rub or gallop  Abdomen: normal findings: soft, non-tender   Vulva:  normal  Vagina: normal vagina  Cervix:  no cervical motion tenderness  Corpus: normal size, contour, position, consistency, mobility, non-tender  Adnexa:  no mass, fullness, tenderness  Rectal Exam: Not performed.           Assessment:     Normal postpartum exam. Pap smear not done at today's visit.    Plan:    1. Contraception: IUD 2. ParaGuard IUD Rx 3. Follow up in: 2 weeks for ParaGuard insertion  Healthy lifestyle practices reviewed

## 2015-07-27 LAB — GC/CHLAMYDIA PROBE AMP
CT Probe RNA: NEGATIVE
GC PROBE AMP APTIMA: NEGATIVE

## 2015-08-09 ENCOUNTER — Telehealth: Payer: Self-pay | Admitting: *Deleted

## 2015-08-09 NOTE — Telephone Encounter (Signed)
Patient interested in a ParaGard IUD for contraception.  Attempted to contact the patient and left message with female for patient to call the office.

## 2015-08-15 ENCOUNTER — Ambulatory Visit (INDEPENDENT_AMBULATORY_CARE_PROVIDER_SITE_OTHER): Payer: BLUE CROSS/BLUE SHIELD | Admitting: Internal Medicine

## 2015-08-15 VITALS — BP 118/82 | HR 64 | Temp 98.0°F | Resp 18 | Ht 66.0 in | Wt 171.1 lb

## 2015-08-15 DIAGNOSIS — Z0000 Encounter for general adult medical examination without abnormal findings: Secondary | ICD-10-CM

## 2015-08-15 DIAGNOSIS — E663 Overweight: Secondary | ICD-10-CM | POA: Diagnosis not present

## 2015-08-15 DIAGNOSIS — E559 Vitamin D deficiency, unspecified: Secondary | ICD-10-CM | POA: Diagnosis not present

## 2015-08-15 NOTE — Patient Instructions (Signed)
Health Maintenance Adopting a healthy lifestyle and getting preventive care can go a long way to promote health and wellness. Talk with your health care provider about what schedule of regular examinations is right for you. This is a good chance for you to check in with your provider about disease prevention and staying healthy. In between checkups, there are plenty of things you can do on your own. Experts have done a lot of research about which lifestyle changes and preventive measures are most likely to keep you healthy. Ask your health care provider for more information. WEIGHT AND DIET  Eat a healthy diet  Be sure to include plenty of vegetables, fruits, low-fat dairy products, and lean protein.  Do not eat a lot of foods high in solid fats, added sugars, or salt.  Get regular exercise. This is one of the most important things you can do for your health.  Most adults should exercise for at least 150 minutes each week. The exercise should increase your heart rate and make you sweat (moderate-intensity exercise).  Most adults should also do strengthening exercises at least twice a week. This is in addition to the moderate-intensity exercise.  Maintain a healthy weight  Body mass index (BMI) is a measurement that can be used to identify possible weight problems. It estimates body fat based on height and weight. Your health care provider can help determine your BMI and help you achieve or maintain a healthy weight.  For females 25 years of age and older:   A BMI below 18.5 is considered underweight.  A BMI of 18.5 to 24.9 is normal.  A BMI of 25 to 29.9 is considered overweight.  A BMI of 30 and above is considered obese.  Watch levels of cholesterol and blood lipids  You should start having your blood tested for lipids and cholesterol at 32 years of age, then have this test every 5 years.  You may need to have your cholesterol levels checked more often if:  Your lipid or  cholesterol levels are high.  You are older than 32 years of age.  You are at high risk for heart disease.  CANCER SCREENING   Lung Cancer  Lung cancer screening is recommended for adults 97-92 years old who are at high risk for lung cancer because of a history of smoking.  A yearly low-dose CT scan of the lungs is recommended for people who:  Currently smoke.  Have quit within the past 15 years.  Have at least a 30-pack-year history of smoking. A pack year is smoking an average of one pack of cigarettes a day for 1 year.  Yearly screening should continue until it has been 15 years since you quit.  Yearly screening should stop if you develop a health problem that would prevent you from having lung cancer treatment.  Breast Cancer  Practice breast self-awareness. This means understanding how your breasts normally appear and feel.  It also means doing regular breast self-exams. Let your health care provider know about any changes, no matter how small.  If you are in your 20s or 30s, you should have a clinical breast exam (CBE) by a health care provider every 1-3 years as part of a regular health exam.  If you are 76 or older, have a CBE every year. Also consider having a breast X-ray (mammogram) every year.  If you have a family history of breast cancer, talk to your health care provider about genetic screening.  If you are  at high risk for breast cancer, talk to your health care provider about having an MRI and a mammogram every year.  Breast cancer gene (BRCA) assessment is recommended for women who have family members with BRCA-related cancers. BRCA-related cancers include:  Breast.  Ovarian.  Tubal.  Peritoneal cancers.  Results of the assessment will determine the need for genetic counseling and BRCA1 and BRCA2 testing. Cervical Cancer Routine pelvic examinations to screen for cervical cancer are no longer recommended for nonpregnant women who are considered low  risk for cancer of the pelvic organs (ovaries, uterus, and vagina) and who do not have symptoms. A pelvic examination may be necessary if you have symptoms including those associated with pelvic infections. Ask your health care provider if a screening pelvic exam is right for you.   The Pap test is the screening test for cervical cancer for women who are considered at risk.  If you had a hysterectomy for a problem that was not cancer or a condition that could lead to cancer, then you no longer need Pap tests.  If you are older than 65 years, and you have had normal Pap tests for the past 10 years, you no longer need to have Pap tests.  If you have had past treatment for cervical cancer or a condition that could lead to cancer, you need Pap tests and screening for cancer for at least 20 years after your treatment.  If you no longer get a Pap test, assess your risk factors if they change (such as having a new sexual partner). This can affect whether you should start being screened again.  Some women have medical problems that increase their chance of getting cervical cancer. If this is the case for you, your health care provider may recommend more frequent screening and Pap tests.  The human papillomavirus (HPV) test is another test that may be used for cervical cancer screening. The HPV test looks for the virus that can cause cell changes in the cervix. The cells collected during the Pap test can be tested for HPV.  The HPV test can be used to screen women 30 years of age and older. Getting tested for HPV can extend the interval between normal Pap tests from three to five years.  An HPV test also should be used to screen women of any age who have unclear Pap test results.  After 32 years of age, women should have HPV testing as often as Pap tests.  Colorectal Cancer  This type of cancer can be detected and often prevented.  Routine colorectal cancer screening usually begins at 32 years of  age and continues through 32 years of age.  Your health care provider may recommend screening at an earlier age if you have risk factors for colon cancer.  Your health care provider may also recommend using home test kits to check for hidden blood in the stool.  A small camera at the end of a tube can be used to examine your colon directly (sigmoidoscopy or colonoscopy). This is done to check for the earliest forms of colorectal cancer.  Routine screening usually begins at age 50.  Direct examination of the colon should be repeated every 5-10 years through 32 years of age. However, you may need to be screened more often if early forms of precancerous polyps or small growths are found. Skin Cancer  Check your skin from head to toe regularly.  Tell your health care provider about any new moles or changes in   moles, especially if there is a change in a mole's shape or color.  Also tell your health care provider if you have a mole that is larger than the size of a pencil eraser.  Always use sunscreen. Apply sunscreen liberally and repeatedly throughout the day.  Protect yourself by wearing long sleeves, pants, a wide-brimmed hat, and sunglasses whenever you are outside. HEART DISEASE, DIABETES, AND HIGH BLOOD PRESSURE   Have your blood pressure checked at least every 1-2 years. High blood pressure causes heart disease and increases the risk of stroke.  If you are between 75 years and 42 years old, ask your health care provider if you should take aspirin to prevent strokes.  Have regular diabetes screenings. This involves taking a blood sample to check your fasting blood sugar level.  If you are at a normal weight and have a low risk for diabetes, have this test once every three years after 32 years of age.  If you are overweight and have a high risk for diabetes, consider being tested at a younger age or more often. PREVENTING INFECTION  Hepatitis B  If you have a higher risk for  hepatitis B, you should be screened for this virus. You are considered at high risk for hepatitis B if:  You were born in a country where hepatitis B is common. Ask your health care provider which countries are considered high risk.  Your parents were born in a high-risk country, and you have not been immunized against hepatitis B (hepatitis B vaccine).  You have HIV or AIDS.  You use needles to inject street drugs.  You live with someone who has hepatitis B.  You have had sex with someone who has hepatitis B.  You get hemodialysis treatment.  You take certain medicines for conditions, including cancer, organ transplantation, and autoimmune conditions. Hepatitis C  Blood testing is recommended for:  Everyone born from [birth year] through 1963.  Anyone with known risk factors for hepatitis C. Sexually transmitted infections (STIs)  You should be screened for sexually transmitted infections (STIs) including gonorrhea and chlamydia if:  You are sexually active and are younger than 32 years of age.  You are older than 32 years of age and your health care provider tells you that you are at risk for this type of infection.  Your sexual activity has changed since you were last screened and you are at an increased risk for chlamydia or gonorrhea. Ask your health care provider if you are at risk.  If you do not have HIV, but are at risk, it may be recommended that you take a prescription medicine daily to prevent HIV infection. This is called pre-exposure prophylaxis (PrEP). You are considered at risk if:  You are sexually active and do not regularly use condoms or know the HIV status of your partner(s).  You take drugs by injection.  You are sexually active with a partner who has HIV. Talk with your health care provider about whether you are at high risk of being infected with HIV. If you choose to begin PrEP, you should first be tested for HIV. You should then be tested every 3 months for  as long as you are taking PrEP.  PREGNANCY   If you are premenopausal and you may become pregnant, ask your health care provider about preconception counseling.  If you may become pregnant, take 400 to 800 micrograms (mcg) of folic acid every day.  If you want to prevent pregnancy, talk to your  health care provider about birth control (contraception). OSTEOPOROSIS AND MENOPAUSE   Osteoporosis is a disease in which the bones lose minerals and strength with aging. This can result in serious bone fractures. Your risk for osteoporosis can be identified using a bone density scan.  If you are 16 years of age or older, or if you are at risk for osteoporosis and fractures, ask your health care provider if you should be screened.  Ask your health care provider whether you should take a calcium or vitamin D supplement to lower your risk for osteoporosis.  Menopause may have certain physical symptoms and risks.  Hormone replacement therapy may reduce some of these symptoms and risks. Talk to your health care provider about whether hormone replacement therapy is right for you.  HOME CARE INSTRUCTIONS   Schedule regular health, dental, and eye exams.  Stay current with your immunizations.   Do not use any tobacco products including cigarettes, chewing tobacco, or electronic cigarettes.  If you are pregnant, do not drink alcohol.  If you are breastfeeding, limit how much and how often you drink alcohol.  Limit alcohol intake to no more than 1 drink per day for nonpregnant women. One drink equals 12 ounces of beer, 5 ounces of wine, or 1 ounces of hard liquor.  Do not use street drugs.  Do not share needles.  Ask your health care provider for help if you need support or information about quitting drugs.  Tell your health care provider if you often feel depressed.  Tell your health care provider if you have ever been abused or do not feel safe at home. Document Released: 06/17/2011  Document Revised: 04/18/2014 Document Reviewed: 11/03/2013 [facility] Patient Information 2015 [place], [place]. This information is not intended to replace advice given to you by your health care provider. Make sure you discuss any questions you have with your health care provider. Vitamin D Deficiency Vitamin D is an important vitamin that your body needs. Having too little of it in your body is called a deficiency. A very bad deficiency can make your bones soft and can cause a condition called rickets.  Vitamin D is important to your body for different reasons, such as:   It helps your body absorb 2 minerals called calcium and phosphorus.  It helps make your bones healthy.  It may prevent some diseases, such as diabetes and multiple sclerosis.  It helps your muscles and heart. You can get vitamin D in several ways. It is a natural part of some foods. The vitamin is also added to some dairy products and cereals. Some people take vitamin D supplements. Also, your body makes vitamin D when you are in the sun. It changes the sun's rays into a form of the vitamin that your body can use. CAUSES   Not eating enough foods that contain vitamin D.  Not getting enough sunlight.  Having certain digestive system diseases that make it hard to absorb vitamin D. These diseases include Crohn's disease, chronic pancreatitis, and cystic fibrosis.  Having a surgery in which part of the stomach or small intestine is removed.  Being obese. Fat cells pull vitamin D out of your blood. That means that obese people may not have enough vitamin D left in their blood and in other body tissues.  Having chronic kidney or liver disease. RISK FACTORS Risk factors are things that make you more likely to develop a vitamin D deficiency. They include:  Being older.  Not being able to  get outside very much.  Living in a nursing home.  Having had broken bones.  Having weak or thin bones (osteoporosis).  Having a  disease or condition that changes how your body absorbs vitamin D.  Having dark skin.  Some medicines such as seizure medicines or steroids.  Being overweight or obese. SYMPTOMS Mild cases of vitamin D deficiency may not have any symptoms. If you have a very bad case, symptoms may include:  Bone pain.  Muscle pain.  Falling often.  Broken bones caused by a minor injury, due to osteoporosis. DIAGNOSIS A blood test is the best way to tell if you have a vitamin D deficiency. TREATMENT Vitamin D deficiency can be treated in different ways. Treatment for vitamin D deficiency depends on what is causing it. Options include:  Taking vitamin D supplements.  Taking a calcium supplement. Your caregiver will suggest what dose is best for you. HOME CARE INSTRUCTIONS  Take any supplements that your caregiver prescribes. Follow the directions carefully. Take only the suggested amount.  Have your blood tested 2 months after you start taking supplements.  Eat foods that contain vitamin D. Healthy choices include:  Fortified dairy products, cereals, or juices. Fortified means vitamin D has been added to the food. Check the label on the package to be sure.  Fatty fish like salmon or trout.  Eggs.  Oysters.  Do not use a tanning bed.  Keep your weight at a healthy level. Lose weight if you need to.  Keep all follow-up appointments. Your caregiver will need to perform blood tests to make sure your vitamin D deficiency is going away. SEEK MEDICAL CARE IF:  You have any questions about your treatment.  You continue to have symptoms of vitamin D deficiency.  You have nausea or vomiting.  You are constipated.  You feel confused.  You have severe abdominal or back pain. MAKE SURE YOU:  Understand these instructions.  Will watch your condition.  Will get help right away if you are not doing well or get worse. Document Released: 02/24/2012 Document Revised: 03/29/2013 Document  Reviewed: 02/24/2012 [facility] Patient Information 2015 [place], [place]. This information is not intended to replace advice given to you by your health care provider. Make sure you discuss any questions you have with your health care provider.

## 2015-08-15 NOTE — Progress Notes (Signed)
[facility] [address] [place] [address] [phone]  Date:  08/15/2015   Name:  Marie Watkins   DOB:  [birth year]   MRN:  [id number]  PCP:  No PCP Per Patient    Chief Complaint: Annual Exam   History of Present Illness:  Marie Watkins is a 34 y.o. very pleasant female patient who presents with the following: Annual visit:  - 9 weeks PP.  Routine SVD. No reported complications. - G10P5.   - Normal Pap with reflex in Jan 2016 - Vitamin D: 14 in 01/2015. Doesn't like dairy. Took PNV.   - TDAP in 2013  Exercise: None. 5 kids at home.  Preschool teacher (40hrs/week).   Diet: "eats what she wants".  - Dinner includes chicken or pork, bread, starch (potatoes or rice), vegetables - Lunch: skips lunch - Breakfast: oatmeal or grits, minimal sugar. Premade oatmeal.  - Soda a day.  Lots of coffee with sugar or cream. No juice or gatorade.   PHQ-2 negative  Recent 2 hour gtt in 03/09/2015 was great. Lipid panel 12/23/2013. Recent HIV neg.     Patient Active Problem List   Diagnosis Date Noted  . Active labor 06/10/2015  . NVD (normal vaginal delivery) 06/10/2015  . [redacted] weeks gestation of pregnancy   . Evaluate anatomy not seen on prior sonogram   . H/O preterm delivery, currently pregnant   . Placenta previa antepartum in second trimester   . Placenta previa antepartum 01/18/2015    Past Medical History  Diagnosis Date  . No pertinent past medical history   . Abnormal Pap smear   . Medical history non-contributory     Past Surgical History  Procedure Laterality Date  . Colposcopy    . Cesarean section    . Tonsillectomy    . Dilation and curettage of uterus      Social History  Substance Use Topics  . Smoking status: Former Games developer  . Smokeless tobacco: Never Used  . Alcohol Use: No    Family History  Problem Relation Age of Onset  . Family history unknown: Yes    No Known Allergies  Medication list has been reviewed and  updated.  Current Outpatient Prescriptions on File Prior to Visit  Medication Sig Dispense Refill  . Fe Heme Polypeptide-Folic Acid (PROFERRIN-FORTE) 12-1 MG TABS Take 1 tablet by mouth daily before breakfast. (Patient not taking: Reported on 08/15/2015) 30 each 11  . ibuprofen (ADVIL,MOTRIN) 600 MG tablet Take 1 tablet (600 mg total) by mouth every 6 (six) hours as needed for mild pain. (Patient not taking: Reported on 08/15/2015) 30 tablet 5  . oxyCODONE-acetaminophen (PERCOCET/ROXICET) 5-325 MG per tablet Take 2 tablets by mouth every 4 (four) hours as needed for moderate pain or severe pain (for pain scale greater than 7). (Patient not taking: Reported on 08/15/2015) 40 tablet 0  . prenatal vitamin w/FE, FA (PRENATAL 1 + 1) 27-1 MG TABS tablet Take 1 tablet by mouth daily at 12 noon.     No current facility-administered medications on file prior to visit.    Review of Systems:  Review of Systems  Constitutional: Negative for fever, chills, weight loss and diaphoresis.  Respiratory: Negative for cough, shortness of breath and wheezing.   Cardiovascular: Negative for chest pain, palpitations and leg swelling.  Gastrointestinal: Negative for nausea, vomiting, diarrhea and constipation.  Skin: Negative for itching and rash.  Neurological: Negative for dizziness, loss of consciousness, weakness and headaches.  Endo/Heme/Allergies: Does not bruise/bleed easily.  Psychiatric/Behavioral: Negative for depression.    Physical Examination: Filed Vitals:   08/15/15 1848  BP: 118/82  Pulse: 64  Temp: 98 F (36.7 C)  Resp: 18   Filed Vitals:   08/15/15 1848  Height: 5\' 6"  (1.676 m)  Weight: 171 lb 2 oz (77.622 kg)   Body mass index is 27.63 kg/(m^2). Ideal Body Weight: Weight in (lb) to have BMI = 25: 154.6  GEN: WDWN, NAD, Non-toxic, A & O x 3 HEENT: Atraumatic, Normocephalic. Neck supple. No masses, No LAD. Ears and Nose: No external deformity. CV: RRR, No M/G/R. No JVD. No thrill.  No extra heart sounds. PULM: CTA B, no wheezes, crackles, rhonchi. No retractions. No resp. distress. No accessory muscle use. ABD: S, NT, ND, +BS. No rebound. No HSM. EXTR: No c/c/e NEURO Normal gait.  PSYCH: Normally interactive. Conversant. Not depressed or anxious appearing.  Calm demeanor.  Declined breast exam after informed decision making.   Assessment and Plan: HME: PP x 9 weeks. Doing well. Following up with OB/GYN for contraception.  Had recent PAP, negative. Lipid wnl 1+ year ago.  2 hour GTT wnl during pregnancy in addition to normal screening. Discussed diet and exercise in overweight female.  F/u 1 year.   Vitamin D Deficiency: 25-OH vit D was 14 during pregnancy.  No previous history of vitamin D. Doesn't like dairy. No symptoms. No current supplementation.  Will recheck and continue supplementation if needed. .   Signed Guinevere Scarlet, MD I have participated in the care of this patient with the Sports Medicine Fellow, and agree with Diagnosis and Plan as documented. Robert P. Merla Riches, M.D.

## 2015-08-16 LAB — VITAMIN D 25 HYDROXY (VIT D DEFICIENCY, FRACTURES): VIT D 25 HYDROXY: 34 ng/mL (ref 30–100)

## 2015-08-22 NOTE — Telephone Encounter (Signed)
Attempted to contact the patient and left message for patient to call the office.  

## 2015-11-16 NOTE — Telephone Encounter (Signed)
Patient returned call to the office. Patient advised to contact the office on the first day of her menstrual cycle. Patient advised to continue to condoms with intercourse. Patient verbalized understanding.

## 2015-11-29 ENCOUNTER — Telehealth: Payer: Self-pay | Admitting: *Deleted

## 2015-11-29 NOTE — Telephone Encounter (Signed)
Patient has started her cycle and is interested in scheduling her ParaGard IUD insertion. Attempted to contact the patient and left message for patient to call the office.

## 2015-11-29 NOTE — Telephone Encounter (Signed)
Patient has been scheduled for IUD insertion on 12-01-15

## 2015-12-01 ENCOUNTER — Encounter: Payer: Self-pay | Admitting: Certified Nurse Midwife

## 2015-12-01 ENCOUNTER — Ambulatory Visit (INDEPENDENT_AMBULATORY_CARE_PROVIDER_SITE_OTHER): Payer: BLUE CROSS/BLUE SHIELD | Admitting: Certified Nurse Midwife

## 2015-12-01 VITALS — BP 126/86 | HR 93 | Temp 98.1°F | Ht 65.0 in | Wt 166.0 lb

## 2015-12-01 DIAGNOSIS — Z3042 Encounter for surveillance of injectable contraceptive: Secondary | ICD-10-CM

## 2015-12-01 DIAGNOSIS — Z30014 Encounter for initial prescription of intrauterine contraceptive device: Secondary | ICD-10-CM

## 2015-12-01 DIAGNOSIS — Z30430 Encounter for insertion of intrauterine contraceptive device: Secondary | ICD-10-CM

## 2015-12-01 DIAGNOSIS — Z01818 Encounter for other preprocedural examination: Secondary | ICD-10-CM

## 2015-12-01 LAB — POCT URINE PREGNANCY: PREG TEST UR: NEGATIVE

## 2015-12-01 NOTE — Progress Notes (Signed)
Patient ID: Marie Watkins, female   DOB: Jan 31, 1981, 34 y.o.   MRN: [id number]  IUD Procedure Note   DIAGNOSIS: Desires long-term, reversible contraception   PROCEDURE: IUD placement Performing Provider: Orvilla Cornwallachelle Tally Mckinnon CNM  Patient counseled prior to procedure. I explained risks and benefits of Paraguard IUD, reviewed alternative forms of contraception. Patient stated understanding and consented to continue with procedure.   LMP: 11/29/15 Pregnancy Test: Negative Lot #: [id number] Expiration Date: Jan. 2023   IUD type: [   ] Mirena   [X]  Paraguard  [  ] Christean GriefSkyla   [  ]  Kyleena  PROCEDURE:  Timeout procedure was performed to ensure right patient and right site.  A bimanual exam was performed to determine the position of the uterus, retroverted. The speculum was placed. The vagina and cervix was sterilized in the usual manner and sterile technique was maintained throughout the course of the procedure. A single toothed tenaculum was not required. The depth of the uterus was sounded to 8cm. The IUD was inserted to the appropriate depth and inserted without difficulty.  The string was cut to an estimated 4 cm length. Bleeding was minimal. The patient tolerated the procedure well.   Follow up: The patient tolerated the procedure well without complications.  Standard post-procedure care is explained and return precautions are given.  Orvilla Cornwallachelle Anthoni Geerts CNM

## 2016-01-05 ENCOUNTER — Ambulatory Visit: Payer: BLUE CROSS/BLUE SHIELD | Admitting: Certified Nurse Midwife

## 2018-09-09 ENCOUNTER — Ambulatory Visit (INDEPENDENT_AMBULATORY_CARE_PROVIDER_SITE_OTHER): Payer: Managed Care, Other (non HMO) | Admitting: Obstetrics

## 2018-09-09 ENCOUNTER — Encounter: Payer: Self-pay | Admitting: Obstetrics

## 2018-09-09 VITALS — BP 133/85 | HR 77 | Wt 162.6 lb

## 2018-09-09 DIAGNOSIS — B373 Candidiasis of vulva and vagina: Secondary | ICD-10-CM

## 2018-09-09 DIAGNOSIS — N898 Other specified noninflammatory disorders of vagina: Secondary | ICD-10-CM

## 2018-09-09 DIAGNOSIS — Z124 Encounter for screening for malignant neoplasm of cervix: Secondary | ICD-10-CM | POA: Diagnosis not present

## 2018-09-09 DIAGNOSIS — Z113 Encounter for screening for infections with a predominantly sexual mode of transmission: Secondary | ICD-10-CM

## 2018-09-09 DIAGNOSIS — Z1151 Encounter for screening for human papillomavirus (HPV): Secondary | ICD-10-CM

## 2018-09-09 DIAGNOSIS — R52 Pain, unspecified: Secondary | ICD-10-CM

## 2018-09-09 DIAGNOSIS — Z01419 Encounter for gynecological examination (general) (routine) without abnormal findings: Secondary | ICD-10-CM | POA: Diagnosis not present

## 2018-09-09 DIAGNOSIS — B3731 Acute candidiasis of vulva and vagina: Secondary | ICD-10-CM

## 2018-09-09 MED ORDER — IBUPROFEN 800 MG PO TABS: 800.0000 mg | ORAL_TABLET | Freq: Three times a day (TID) | ORAL | 5 refills | Status: DC | PRN

## 2018-09-09 MED ORDER — FLUCONAZOLE 150 MG PO TABS: 150.0000 mg | ORAL_TABLET | Freq: Once | ORAL | 2 refills | Status: DC

## 2018-09-09 NOTE — Progress Notes (Signed)
Patient presents for Annual Exam  Last pap:01/12/2015 WNL  LMP:08/11/2018 Contraception: IUD  STD Screening: Full Panel   C/O:  onset yeast infections x days now.

## 2018-09-09 NOTE — Progress Notes (Signed)
Subjective:        Marie Watkins is a 37 y.o. female here for a routine exam.  Current complaints: Vaginal yeast infection.    Personal health questionnaire:  Is patient [name], have a family history of breast and/or ovarian cancer: no Is there a family history of uterine cancer diagnosed at age < 64, gastrointestinal cancer, urinary tract cancer, family member who is a Personnel officer syndrome-associated carrier: no Is the patient overweight and hypertensive, family history of diabetes, personal history of gestational diabetes, preeclampsia or PCOS: no Is patient over 22, have PCOS,  family history of premature CHD under age 20, diabetes, smoke, have hypertension or peripheral artery disease:  no At any time, has a partner hit, kicked or otherwise hurt or frightened you?: no Over the past 2 weeks, have you felt down, depressed or hopeless?: no Over the past 2 weeks, have you felt little interest or pleasure in doing things?:no   Gynecologic History Patient's last menstrual period was 08/11/2018 (exact date). Contraception: IUD Last Pap: 2016. Results were: normal Last mammogram: n/a. Results were: n/a  Obstetric History OB History  Gravida Para Term Preterm AB Living  7 5 4 1 2 5   SAB TAB Ectopic Multiple Live Births  2 0 0 0 5    # Outcome Date GA Lbr Len/2nd Weight Sex Delivery Anes PTL Lv  7 Term 06/10/15 [redacted]w[redacted]d 03:51 / 00:03 5 lb 14 oz (2.665 kg) F Vag-Spont None  LIV  6 Preterm 02/21/12 [redacted]w[redacted]d 09:55 / 00:09 5 lb 11.4 oz (2.591 kg) F Vag-Spont EPI  LIV     Birth Comments: WNL  5 Term 04/15/10 [redacted]w[redacted]d  5 lb 5 oz (2.41 kg) F CS-LTranv EPI N LIV     Complications: Breech presentation  4 Term 11/22/06 [redacted]w[redacted]d  5 lb 2 oz (2.325 kg) M Vag-Spont None N LIV  3 SAB 12/2005          2 SAB 2006     SAB     1 Term 01/30/00 [redacted]w[redacted]d  5 lb 14 oz (2.665 kg) F Vag-Spont None N LIV    Past Medical History:  Diagnosis Date  . Abnormal Pap smear   . Medical history non-contributory   .  No pertinent past medical history     Past Surgical History:  Procedure Laterality Date  . CESAREAN SECTION    . COLPOSCOPY    . DILATION AND CURETTAGE OF UTERUS    . TONSILLECTOMY       Current Outpatient Medications:  .  PARAGARD INTRAUTERINE COPPER IU, by Intrauterine route., Disp: , Rfl:  .  fluconazole (DIFLUCAN) 150 MG tablet, Take 1 tablet (150 mg total) by mouth once for 1 dose., Disp: 1 tablet, Rfl: 2 .  ibuprofen (ADVIL,MOTRIN) 800 MG tablet, Take 1 tablet (800 mg total) by mouth every 8 (eight) hours as needed., Disp: 30 tablet, Rfl: 5 No Known Allergies  Social History   Tobacco Use  . Smoking status: Never Smoker  . Smokeless tobacco: Never Used  Substance Use Topics  . Alcohol use: No    Alcohol/week: 0.0 standard drinks    Family History  Family history unknown: Yes      Review of Systems  Constitutional: negative for fatigue and weight loss Respiratory: negative for cough and wheezing Cardiovascular: negative for chest pain, fatigue and palpitations Gastrointestinal: negative for abdominal pain and change in bowel habits Musculoskeletal:negative for myalgias Neurological: negative for gait problems and tremors Behavioral/Psych: negative for  abusive relationship, depression Endocrine: negative for temperature intolerance    Genitourinary:negative for abnormal menstrual periods, genital lesions, hot flashes, sexual problems and vaginal discharge Integument/breast: negative for breast lump, breast tenderness, nipple discharge and skin lesion(s)    Objective:       BP 133/85   Pulse 77   Wt 162 lb 9.6 oz (73.8 kg)   LMP 08/11/2018 (Exact Date)   BMI 27.06 kg/m  General:   alert  Skin:   no rash or abnormalities  Lungs:   clear to auscultation bilaterally  Heart:   regular rate and rhythm, S1, S2 normal, no murmur, click, rub or gallop  Breasts:   normal without suspicious masses, skin or nipple changes or axillary nodes  Abdomen:  normal findings:  no organomegaly, soft, non-tender and no hernia  Pelvis:  External genitalia: normal general appearance Urinary system: urethral meatus normal and bladder without fullness, nontender Vaginal: normal without tenderness, induration or masses Cervix: normal appearance Adnexa: normal bimanual exam Uterus: anteverted and non-tender, normal size   Lab Review Urine pregnancy test Labs reviewed yes Radiologic studies reviewed no  50% of 20 min visit spent on counseling and coordination of care.   Assessment:     1. Encounter for annual routine gynecological examination - DOING WELL  2. Screening for cervical cancer Rx: - Cytology - PAP  3. Vaginal discharge Rx: - Cervicovaginal ancillary only  4. Screening examination for STD (sexually transmitted disease) Rx: - Hepatitis B surface antigen - Hepatitis C antibody - HIV Antibody (routine testing w rflx) - RPR  5. Candida vaginitis Rx: - fluconazole (DIFLUCAN) 150 MG tablet; Take 1 tablet (150 mg total) by mouth once for 1 dose.  Dispense: 1 tablet; Refill: 2  6. Pain aggravated by activities of daily living Rx: - ibuprofen (ADVIL,MOTRIN) 800 MG tablet; Take 1 tablet (800 mg total) by mouth every 8 (eight) hours as needed.  Dispense: 30 tablet; Refill: 5    Plan:    Education reviewed: calcium supplements, depression evaluation, low fat, low cholesterol diet, safe sex/STD prevention, self breast exams and weight bearing exercise. Contraception: IUD. Follow up in: 1 year.   Meds ordered this encounter  Medications  . fluconazole (DIFLUCAN) 150 MG tablet    Sig: Take 1 tablet (150 mg total) by mouth once for 1 dose.    Dispense:  1 tablet    Refill:  2  . ibuprofen (ADVIL,MOTRIN) 800 MG tablet    Sig: Take 1 tablet (800 mg total) by mouth every 8 (eight) hours as needed.    Dispense:  30 tablet    Refill:  5   Orders Placed This Encounter  Procedures  . Hepatitis B surface antigen  . Hepatitis C antibody  . HIV  Antibody (routine testing w rflx)  . RPR    Brock Bad MD 09-09-2018

## 2018-09-10 LAB — HEPATITIS C ANTIBODY

## 2018-09-10 LAB — HEPATITIS B SURFACE ANTIGEN: HEP B S AG: NEGATIVE

## 2018-09-10 LAB — RPR: RPR Ser Ql: NONREACTIVE

## 2018-09-10 LAB — HIV ANTIBODY (ROUTINE TESTING W REFLEX): HIV Screen 4th Generation wRfx: NONREACTIVE

## 2018-09-11 LAB — CERVICOVAGINAL ANCILLARY ONLY
BACTERIAL VAGINITIS: POSITIVE — AB
Candida vaginitis: POSITIVE — AB
Chlamydia: NEGATIVE
NEISSERIA GONORRHEA: NEGATIVE
Trichomonas: NEGATIVE

## 2018-09-14 ENCOUNTER — Other Ambulatory Visit: Payer: Self-pay | Admitting: Obstetrics

## 2018-09-14 ENCOUNTER — Telehealth: Payer: Self-pay

## 2018-09-14 DIAGNOSIS — N760 Acute vaginitis: Principal | ICD-10-CM

## 2018-09-14 DIAGNOSIS — B3731 Acute candidiasis of vulva and vagina: Secondary | ICD-10-CM

## 2018-09-14 DIAGNOSIS — B9689 Other specified bacterial agents as the cause of diseases classified elsewhere: Secondary | ICD-10-CM

## 2018-09-14 DIAGNOSIS — B373 Candidiasis of vulva and vagina: Secondary | ICD-10-CM

## 2018-09-14 LAB — CYTOLOGY - PAP
DIAGNOSIS: NEGATIVE
HPV 16/18/45 genotyping: NEGATIVE
HPV: DETECTED — AB

## 2018-09-14 MED ORDER — FLUCONAZOLE 150 MG PO TABS: 150.0000 mg | ORAL_TABLET | Freq: Once | ORAL | 0 refills | Status: AC

## 2018-09-14 MED ORDER — TINIDAZOLE 500 MG PO TABS: 1000.0000 mg | ORAL_TABLET | Freq: Every day | ORAL | 2 refills | Status: DC

## 2018-09-14 NOTE — Telephone Encounter (Signed)
Returned call, advised of results and rx sent. 

## 2018-10-21 ENCOUNTER — Encounter: Payer: Self-pay | Admitting: Family Medicine

## 2018-10-21 ENCOUNTER — Other Ambulatory Visit: Payer: Self-pay

## 2018-10-21 ENCOUNTER — Ambulatory Visit (INDEPENDENT_AMBULATORY_CARE_PROVIDER_SITE_OTHER): Payer: Managed Care, Other (non HMO) | Admitting: Family Medicine

## 2018-10-21 VITALS — BP 136/88 | HR 70 | Temp 98.1°F | Ht 66.0 in | Wt 162.4 lb

## 2018-10-21 DIAGNOSIS — H9201 Otalgia, right ear: Secondary | ICD-10-CM | POA: Diagnosis not present

## 2018-10-21 MED ORDER — NEOMYCIN-POLYMYXIN-HC 3.5-10000-1 OT SOLN: 3.0000 [drp] | Freq: Three times a day (TID) | OTIC | 0 refills | Status: AC

## 2018-10-21 NOTE — Patient Instructions (Addendum)
There is a small area of irritation in the right ear.  I do not see any infection at this time, but can use the antibiotic drops with steroid to help with the inflammation/soreness. Ibuprofen over the counter if needed.   Avoid any Q-tips or cotton tip swabs inside the ear canal.  If not improving in the next week return for recheck, sooner if worse.  Thank you for coming in today.    Earache, Adult An earache, or ear pain, can be caused by many things, including:  An infection.  Ear wax buildup.  Ear pressure.  Something in the ear that should not be there (foreign body).  A sore throat.  Tooth problems.  Jaw problems.  Treatment of the earache will depend on the cause. If the cause is not clear or cannot be determined, you may need to watch your symptoms until your earache goes away or until a cause is found. Follow these instructions at home: Pay attention to any changes in your symptoms. Take these actions to help with your pain:  Take or apply over-the-counter and prescription medicines only as told by your health care provider.  If you were prescribed an antibiotic medicine, use it as told by your health care provider. Do not stop using the antibiotic even if you start to feel better.  Do not put anything in your ear other than medicine that is prescribed by your health care provider.  If directed, apply heat to the affected area as often as told by your health care provider. Use the heat source that your health care provider recommends, such as a moist heat pack or a heating pad. ? Place a towel between your skin and the heat source. ? Leave the heat on for 20-30 minutes. ? Remove the heat if your skin turns bright red. This is especially important if you are unable to feel pain, heat, or cold. You may have a greater risk of getting burned.  If directed, put ice on the ear: ? Put ice in a plastic bag. ? Place a towel between your skin and the bag. ? Leave the ice on for  20 minutes, 2-3 times a day.  Try resting in an upright position instead of lying down. This may help to reduce pressure in your ear and relieve pain.  Chew gum if it helps to relieve your ear pain.  Treat any allergies as told by your health care provider.  Keep all follow-up visits as told by your health care provider. This is important.  Contact a health care provider if:  Your pain does not improve within 2 days.  Your earache gets worse.  You have new symptoms.  You have a fever. Get help right away if:  You have a severe headache.  You have a stiff neck.  You have trouble swallowing.  You have redness or swelling behind your ear.  You have fluid or blood coming from your ear.  You have hearing loss.  You feel dizzy. This information is not intended to replace advice given to you by your health care provider. Make sure you discuss any questions you have with your health care provider. Document Released: 07/19/2004 Document Revised: 07/30/2016 Document Reviewed: 05/27/2016 Elsevier Interactive Patient Education  [employer].   If you have lab work done today you will be contacted with your lab results within the next 2 weeks.  If you have not heard from [place] then please contact us. The fastest way to  get your results is to register for My Chart.   IF you received an x-ray today, you will receive an invoice from [facility]. Please contact [facility] at [phone] with questions or concerns regarding your invoice.   IF you received labwork today, you will receive an invoice from [place]. Please contact [facility] at [phone] with questions or concerns regarding your invoice.   Our billing staff will not be able to assist you with questions regarding bills from these companies.  You will be contacted with the lab results as soon as they are available. The fastest way to get your results is to activate your My Chart account. Instructions  are located on the last page of this paperwork. If you have not heard from [place] regarding the results in 2 weeks, please contact this office.

## 2018-10-21 NOTE — Progress Notes (Signed)
Subjective:  By signing my name below, I, Stann Ore, attest that this documentation has been prepared under the direction and in the presence of Meredith Staggers, MD. Electronically Signed: Stann Ore, Scribe. 10/21/2018 , 11:16 AM .  Patient was seen in Room 8 .   Patient ID: Marie Watkins, female    DOB: 1981/02/28, 37 y.o.   MRN: [id number] Chief Complaint  Patient presents with  . rt ear pain    going on 1 week   HPI Marie Watkins is a 37 y.o. female  Patient complains of right ear pain that started about 1 week ago. When she chews, opening and closing her jaw, she's felt some pain. She mentions using a q-tip to clean her ear daily; denies any drainage or blood. She denies history of TMJ syndrome. She denies taking any medications for this. She denies any recent cold or congestion. She denies any sick contact.   Patient Active Problem List   Diagnosis Date Noted  . Active labor 06/10/2015  . NVD (normal vaginal delivery) 06/10/2015  . [redacted] weeks gestation of pregnancy   . Evaluate anatomy not seen on prior sonogram   . H/O preterm delivery, currently pregnant   . Placenta previa antepartum in second trimester   . Placenta previa antepartum 01/18/2015   Past Medical History:  Diagnosis Date  . Abnormal Pap smear   . Medical history non-contributory   . No pertinent past medical history    Past Surgical History:  Procedure Laterality Date  . CESAREAN SECTION    . COLPOSCOPY    . DILATION AND CURETTAGE OF UTERUS    . TONSILLECTOMY     No Known Allergies Prior to Admission medications   Medication Sig Start Date End Date Taking? Authorizing Provider  ibuprofen (ADVIL,MOTRIN) 800 MG tablet Take 1 tablet (800 mg total) by mouth every 8 (eight) hours as needed. 09/09/18   Brock Bad, MD  PARAGARD INTRAUTERINE COPPER IU by Intrauterine route.    [provider]  tinidazole (TINDAMAX) 500 MG tablet Take 2 tablets (1,000 mg total) by mouth daily with  breakfast. 09/14/18   Brock Bad, MD   Social History   Socioeconomic History  . Marital status: Single    Spouse name: Not on file  . Number of children: Not on file  . Years of education: Not on file  . Highest education level: Not on file  Occupational History  . Not on file  Social Needs  . Financial resource strain: Not on file  . Food insecurity:    Worry: Not on file    Inability: Not on file  . Transportation needs:    Medical: Not on file    Non-medical: Not on file  Tobacco Use  . Smoking status: Never Smoker  . Smokeless tobacco: Never Used  Substance and Sexual Activity  . Alcohol use: No    Alcohol/week: 0.0 standard drinks  . Drug use: No  . Sexual activity: Yes    Partners: Male    Birth control/protection: None, IUD  Lifestyle  . Physical activity:    Days per week: Not on file    Minutes per session: Not on file  . Stress: Not on file  Relationships  . Social connections:    Talks on phone: Not on file    Gets together: Not on file    Attends religious service: Not on file    Active member of club or organization: Not on file  Attends meetings of clubs or organizations: Not on file    Relationship status: Not on file  . Intimate partner violence:    Fear of current or ex partner: Not on file    Emotionally abused: Not on file    Physically abused: Not on file    Forced sexual activity: Not on file  Other Topics Concern  . Not on file  Social History Narrative  . Not on file   Review of Systems  Constitutional: Negative for chills, fatigue, fever and unexpected weight change.  HENT: Positive for ear pain. Negative for congestion, ear discharge and hearing loss.   Respiratory: Negative for cough.   Gastrointestinal: Negative for constipation, diarrhea, nausea and vomiting.  Skin: Negative for rash and wound.  Neurological: Negative for dizziness, weakness and headaches.       Objective:   Physical Exam  Constitutional: She is  oriented to person, place, and time. She appears well-developed and well-nourished. No distress.  HENT:  Head: Normocephalic and atraumatic.  Right Ear: Tympanic membrane normal.  Left Ear: Tympanic membrane normal.  Right ear: minimal erythema external canal, but no edema or exudate; no pain with pulling of pinna No popping or clicking at TMJ, no pain   Eyes: Pupils are equal, round, and reactive to light. EOM are normal.  Neck: Neck supple.  Cardiovascular: Normal rate.  Pulmonary/Chest: Effort normal. No respiratory distress.  Musculoskeletal: Normal range of motion.  Neurological: She is alert and oriented to person, place, and time.  Skin: Skin is warm and dry.  Psychiatric: She has a normal mood and affect. Her behavior is normal.  Nursing note and vitals reviewed.   Vitals:   10/21/18 1049  BP: 136/88  Pulse: 70  Temp: 98.1 F (36.7 C)  TempSrc: Oral  SpO2: 100%  Weight: 162 lb 6.4 oz (73.7 kg)  Height: 5\' 6"  (1.676 m)       Assessment & Plan:    Marie Watkins is a 37 y.o. female Right ear pain - Plan: neomycin-polymyxin-hydrocortisone (CORTISPORIN) OTIC solution Possible small abrasion/irritation of the external canal.  Does not appear to be TMJ syndrome at this time.  Advised to avoid cotton tip swabs or other objects within the ear canal.  Start Cortisporin otic 3 times daily,ibuprofen if needed, RTC precautions if not improving  Meds ordered this encounter  Medications  . neomycin-polymyxin-hydrocortisone (CORTISPORIN) OTIC solution    Sig: Place 3 drops into the right ear 3 (three) times daily for 7 days.    Dispense:  10 mL    Refill:  0   Patient Instructions    There is a small area of irritation in the right ear.  I do not see any infection at this time, but can use the antibiotic drops with steroid to help with the inflammation/soreness. Ibuprofen over the counter if needed.   Avoid any Q-tips or cotton tip swabs inside the ear canal.  If not  improving in the next week return for recheck, sooner if worse.  Thank you for coming in today.    Earache, Adult An earache, or ear pain, can be caused by many things, including:  An infection.  Ear wax buildup.  Ear pressure.  Something in the ear that should not be there (foreign body).  A sore throat.  Tooth problems.  Jaw problems.  Treatment of the earache will depend on the cause. If the cause is not clear or cannot be determined, you may need to watch your  symptoms until your earache goes away or until a cause is found. Follow these instructions at home: Pay attention to any changes in your symptoms. Take these actions to help with your pain:  Take or apply over-the-counter and prescription medicines only as told by your health care provider.  If you were prescribed an antibiotic medicine, use it as told by your health care provider. Do not stop using the antibiotic even if you start to feel better.  Do not put anything in your ear other than medicine that is prescribed by your health care provider.  If directed, apply heat to the affected area as often as told by your health care provider. Use the heat source that your health care provider recommends, such as a moist heat pack or a heating pad. ? Place a towel between your skin and the heat source. ? Leave the heat on for 20-30 minutes. ? Remove the heat if your skin turns bright red. This is especially important if you are unable to feel pain, heat, or cold. You may have a greater risk of getting burned.  If directed, put ice on the ear: ? Put ice in a plastic bag. ? Place a towel between your skin and the bag. ? Leave the ice on for 20 minutes, 2-3 times a day.  Try resting in an upright position instead of lying down. This may help to reduce pressure in your ear and relieve pain.  Chew gum if it helps to relieve your ear pain.  Treat any allergies as told by your health care provider.  Keep all follow-up  visits as told by your health care provider. This is important.  Contact a health care provider if:  Your pain does not improve within 2 days.  Your earache gets worse.  You have new symptoms.  You have a fever. Get help right away if:  You have a severe headache.  You have a stiff neck.  You have trouble swallowing.  You have redness or swelling behind your ear.  You have fluid or blood coming from your ear.  You have hearing loss.  You feel dizzy. This information is not intended to replace advice given to you by your health care provider. Make sure you discuss any questions you have with your health care provider. Document Released: 07/19/2004 Document Revised: 07/30/2016 Document Reviewed: 05/27/2016 Elsevier Interactive Patient Education  [employer].   If you have lab work done today you will be contacted with your lab results within the next 2 weeks.  If you have not heard from [place] then please contact us. The fastest way to get your results is to register for My Chart.   IF you received an x-ray today, you will receive an invoice from [facility]. Please contact [facility] at [phone] with questions or concerns regarding your invoice.   IF you received labwork today, you will receive an invoice from [facility]. Please contact [facility] at [phone] with questions or concerns regarding your invoice.   Our billing staff will not be able to assist you with questions regarding bills from these companies.  You will be contacted with the lab results as soon as they are available. The fastest way to get your results is to activate your My Chart account. Instructions are located on the last page of this paperwork. If you have not heard from [place] regarding the results in 2 weeks, please contact this office.       I personally performed the  services described in this documentation, which was scribed in my presence. The recorded information has  been reviewed and considered for accuracy and completeness, addended by me as needed, and agree with information above.  Signed,   Meredith Staggers, MD [facility] at [facility].  10/21/18 12:22 PM

## 2018-10-27 ENCOUNTER — Ambulatory Visit: Payer: Managed Care, Other (non HMO) | Admitting: Family Medicine

## 2018-11-11 ENCOUNTER — Ambulatory Visit: Payer: Managed Care, Other (non HMO)

## 2018-11-11 VITALS — BP 122/82 | HR 82 | Wt 163.0 lb

## 2018-11-11 DIAGNOSIS — R300 Dysuria: Secondary | ICD-10-CM

## 2018-11-11 DIAGNOSIS — N390 Urinary tract infection, site not specified: Secondary | ICD-10-CM

## 2018-11-11 LAB — POCT URINALYSIS DIPSTICK
GLUCOSE UA: NEGATIVE
LEUKOCYTES UA: NEGATIVE
NITRITE UA: NEGATIVE
PROTEIN UA: POSITIVE — AB
RBC UA: NEGATIVE
SPEC GRAV UA: 1.025 (ref 1.010–1.025)
Urobilinogen, UA: 0.2 E.U./dL
pH, UA: 5 (ref 5.0–8.0)

## 2018-11-11 MED ORDER — NITROFURANTOIN MONOHYD MACRO 100 MG PO CAPS: 100.0000 mg | ORAL_CAPSULE | Freq: Two times a day (BID) | ORAL | 2 refills | Status: DC

## 2018-11-11 NOTE — Progress Notes (Signed)
SUBJECTIVE: Marie Watkins is a 37 y.o. female who complains of urinary frequency, urgency and dysuria x 5 days, without flank pain, fever, chills, or abnormal vaginal discharge or bleeding.   OBJECTIVE: Appears well, in no apparent distress.  Vital signs are normal. Urine dipstick shows positive for ketones, + protein, +Bil.    ASSESSMENT: Dysuria  PLAN: Treatment per orders, urine sent for culture.  Call or return to clinic prn if these symptoms worsen or fail to improve as anticipated.

## 2018-11-13 LAB — URINE CULTURE: ORGANISM ID, BACTERIA: NO GROWTH

## 2019-06-20 ENCOUNTER — Other Ambulatory Visit: Payer: Self-pay | Admitting: Obstetrics

## 2019-06-20 DIAGNOSIS — B373 Candidiasis of vulva and vagina: Secondary | ICD-10-CM

## 2019-06-20 DIAGNOSIS — B3731 Acute candidiasis of vulva and vagina: Secondary | ICD-10-CM

## 2019-08-16 ENCOUNTER — Encounter: Payer: Self-pay | Admitting: Advanced Practice Midwife

## 2019-08-16 ENCOUNTER — Ambulatory Visit (INDEPENDENT_AMBULATORY_CARE_PROVIDER_SITE_OTHER): Payer: Managed Care, Other (non HMO) | Admitting: Advanced Practice Midwife

## 2019-08-16 ENCOUNTER — Other Ambulatory Visit: Payer: Self-pay

## 2019-08-16 VITALS — BP 134/87 | HR 80 | Temp 98.2°F | Wt 153.0 lb

## 2019-08-16 DIAGNOSIS — B9689 Other specified bacterial agents as the cause of diseases classified elsewhere: Secondary | ICD-10-CM | POA: Diagnosis not present

## 2019-08-16 DIAGNOSIS — N760 Acute vaginitis: Secondary | ICD-10-CM

## 2019-08-16 DIAGNOSIS — Z113 Encounter for screening for infections with a predominantly sexual mode of transmission: Secondary | ICD-10-CM

## 2019-08-16 DIAGNOSIS — B373 Candidiasis of vulva and vagina: Secondary | ICD-10-CM

## 2019-08-16 DIAGNOSIS — N898 Other specified noninflammatory disorders of vagina: Secondary | ICD-10-CM | POA: Diagnosis not present

## 2019-08-16 MED ORDER — FLUCONAZOLE 150 MG PO TABS: 150.0000 mg | ORAL_TABLET | Freq: Once | ORAL | 0 refills | Status: AC

## 2019-08-16 NOTE — Patient Instructions (Signed)

## 2019-08-16 NOTE — Progress Notes (Signed)
[name] pt presents for problem visit today.  CC: Recurrent yeast infections x 2 months. Pt states after taking diflucan sx's return after 5-7 days later.   Patient has also tried natural remedies to cure yeast such as coconut oil and apple cider vinegar.   Pt consents to resident/student being present in exam room.

## 2019-08-16 NOTE — Progress Notes (Signed)
GYNECOLOGY PROGRESS NOTE  History:  38 y.o. [id number] presents to [facility] office today for problem gyn visit. She reports vaginal discharge, itching and irritation since June. She reports that she thinks it is a yeast infection and was Rx'd Diflucan several times, last took 2 weeks ago. Reports the Diflucan helps for about 1 week and then symptoms return. She started using coconut oil and apple cider vinegar 1 week ago to help relieve symptoms, which she reports has helped. She denies abdominal/pelvic pain, vaginal bleeding, urinary symptoms, h/a, dizziness, shortness of breath, n/v, or fever/chills.    The following portions of the patient's history were reviewed and updated as appropriate: allergies, current medications, past family history, past medical history, past social history, past surgical history and problem list. Last pap smear on 09/09/18 was normal, negative HRHPV.  Review of Systems:  Pertinent items are noted in HPI.   Objective:  Physical Exam Blood pressure 134/87, pulse 80, temperature 98.2 F (36.8 C), temperature source Oral, weight 69.4 kg. VS reviewed, nursing note reviewed,  Constitutional: well developed, well nourished, no distress HEENT: normocephalic CV: normal rate Pulm/chest wall: normal effort Breast Exam: deferred Abdomen: soft Neuro: alert and oriented x 3 Skin: warm, dry Psych: affect normal Pelvic exam: Deferred speculum exam. External genitalia without redness, lesions, or excoriation. Blind swab collected.  Bimanual exam: Deferred  Assessment & Plan:  1. Vaginal discharge -Vaginal discharge, itching, irritation since June -Pt given options to wait for results, or to have Diflucan or a vaginal cream sent to pharmacy -Pt would like to try another dose of Diflucan. Rx sent to pharmacy  - Cervicovaginal ancillary only( [place])  2. Screening examination for STD (sexually transmitted disease) -Pt requesting STD testing  - HIV Antibody (routine  testing w rflx) - RPR - Hepatitis B surface antigen - Hepatitis C antibody  Pt to follow up as needed

## 2019-08-17 LAB — HEPATITIS C ANTIBODY: Hep C Virus Ab: <0.1 s/co ratio (ref 0.0–0.9)

## 2019-08-17 LAB — RPR: RPR Ser Ql: NONREACTIVE

## 2019-08-17 LAB — HEPATITIS B SURFACE ANTIGEN: Hepatitis B Surface Ag: NEGATIVE

## 2019-08-17 LAB — HIV ANTIBODY (ROUTINE TESTING W REFLEX): HIV Screen 4th Generation wRfx: NONREACTIVE

## 2019-08-19 LAB — CERVICOVAGINAL ANCILLARY ONLY
Bacterial vaginitis: POSITIVE — AB
Candida vaginitis: POSITIVE — AB
Chlamydia: NEGATIVE
Neisseria Gonorrhea: NEGATIVE
Trichomonas: NEGATIVE

## 2019-09-13 ENCOUNTER — Ambulatory Visit: Payer: Managed Care, Other (non HMO) | Admitting: Advanced Practice Midwife

## 2019-09-23 ENCOUNTER — Telehealth: Payer: Self-pay | Admitting: Advanced Practice Midwife

## 2019-09-23 MED ORDER — FLUCONAZOLE 150 MG PO TABS: 150.0000 mg | ORAL_TABLET | Freq: Once | ORAL | 0 refills | Status: AC

## 2019-09-23 MED ORDER — TINIDAZOLE 500 MG PO TABS: 2.0000 g | ORAL_TABLET | Freq: Every day | ORAL | 0 refills | Status: AC

## 2019-09-23 NOTE — Telephone Encounter (Signed)
Patient called with recurrent symptoms of BV and yeast.  She had both went tested in August. Rx refill requested by patient.  Rx routed to pharmacy per protocol.  Patient has an upcoming annual scheduled.

## 2019-10-12 ENCOUNTER — Ambulatory Visit (INDEPENDENT_AMBULATORY_CARE_PROVIDER_SITE_OTHER): Payer: Managed Care, Other (non HMO) | Admitting: Advanced Practice Midwife

## 2019-10-12 ENCOUNTER — Other Ambulatory Visit: Payer: Self-pay

## 2019-10-12 ENCOUNTER — Encounter: Payer: Self-pay | Admitting: Advanced Practice Midwife

## 2019-10-12 VITALS — BP 121/72 | HR 88 | Temp 98.1°F | Ht 66.0 in | Wt 149.8 lb

## 2019-10-12 DIAGNOSIS — R87820 Cervical low risk human papillomavirus (HPV) DNA test positive: Secondary | ICD-10-CM | POA: Insufficient documentation

## 2019-10-12 DIAGNOSIS — Z124 Encounter for screening for malignant neoplasm of cervix: Secondary | ICD-10-CM | POA: Diagnosis not present

## 2019-10-12 DIAGNOSIS — Z1151 Encounter for screening for human papillomavirus (HPV): Secondary | ICD-10-CM | POA: Diagnosis not present

## 2019-10-12 DIAGNOSIS — Z113 Encounter for screening for infections with a predominantly sexual mode of transmission: Secondary | ICD-10-CM | POA: Diagnosis not present

## 2019-10-12 DIAGNOSIS — Z975 Presence of (intrauterine) contraceptive device: Secondary | ICD-10-CM | POA: Insufficient documentation

## 2019-10-12 DIAGNOSIS — N898 Other specified noninflammatory disorders of vagina: Secondary | ICD-10-CM

## 2019-10-12 DIAGNOSIS — Z01419 Encounter for gynecological examination (general) (routine) without abnormal findings: Secondary | ICD-10-CM

## 2019-10-12 DIAGNOSIS — B373 Candidiasis of vulva and vagina: Secondary | ICD-10-CM

## 2019-10-12 NOTE — Patient Instructions (Signed)
Healthy Eating Following a healthy eating pattern may help you to achieve and maintain a healthy body weight, reduce the risk of chronic disease, and live a long and productive life. It is important to follow a healthy eating pattern at an appropriate calorie level for your body. Your nutritional needs should be met primarily through food by choosing a variety of nutrient-rich foods. What are tips for following this plan? Reading food labels  Read labels and choose the following: ? Reduced or low sodium. ? Juices with 100% fruit juice. ? Foods with low saturated fats and high polyunsaturated and monounsaturated fats. ? Foods with whole grains, such as whole wheat, cracked wheat, brown rice, and wild rice. ? Whole grains that are fortified with folic acid. This is recommended for women who are pregnant or who want to become pregnant.  Read labels and avoid the following: ? Foods with a lot of added sugars. These include foods that contain brown sugar, corn sweetener, corn syrup, dextrose, fructose, glucose, high-fructose corn syrup, honey, invert sugar, lactose, malt syrup, maltose, molasses, raw sugar, sucrose, trehalose, or turbinado sugar.  Do not eat more than the following amounts of added sugar per day:  6 teaspoons (25 g) for women.  9 teaspoons (38 g) for men. ? Foods that contain processed or refined starches and grains. ? Refined grain products, such as white flour, degermed cornmeal, white bread, and white rice. Shopping  Choose nutrient-rich snacks, such as vegetables, whole fruits, and nuts. Avoid high-calorie and high-sugar snacks, such as potato chips, fruit snacks, and candy.  Use oil-based dressings and spreads on foods instead of solid fats such as butter, stick margarine, or cream cheese.  Limit pre-made sauces, mixes, and "instant" products such as flavored rice, instant noodles, and ready-made pasta.  Try more plant-protein sources, such as tofu, tempeh, black beans,  edamame, lentils, nuts, and seeds.  Explore eating plans such as the Mediterranean diet or vegetarian diet. Cooking  Use oil to saut or stir-fry foods instead of solid fats such as butter, stick margarine, or lard.  Try baking, boiling, grilling, or broiling instead of frying.  Remove the fatty part of meats before cooking.  Steam vegetables in water or broth. Meal planning   At meals, imagine dividing your plate into fourths: ? One-half of your plate is fruits and vegetables. ? One-fourth of your plate is whole grains. ? One-fourth of your plate is protein, especially lean meats, poultry, eggs, tofu, beans, or nuts.  Include low-fat dairy as part of your daily diet. Lifestyle  Choose healthy options in all settings, including home, work, school, restaurants, or stores.  Prepare your food safely: ? Wash your hands after handling raw meats. ? Keep food preparation surfaces clean by regularly washing with hot, soapy water. ? Keep raw meats separate from ready-to-eat foods, such as fruits and vegetables. ? Cook seafood, meat, poultry, and eggs to the recommended internal temperature. ? Store foods at safe temperatures. In general:  Keep cold foods at 59F (4.4C) or below.  Keep hot foods at 159F (60C) or above.  Keep your freezer at [facility] (-17.8C) or below.  Foods are no longer safe to eat when they have been between the temperatures of 40-159F (4.4-60C) for more than 2 hours. What foods should I eat? Fruits Aim to eat 2 cup-equivalents of fresh, canned (in natural juice), or frozen fruits each day. Examples of 1 cup-equivalent of fruit include 1 small apple, 8 large strawberries, 1 cup canned fruit,  cup  dried fruit, or 1 cup 100% juice. Vegetables Aim to eat 2-3 cup-equivalents of fresh and frozen vegetables each day, including different varieties and colors. Examples of 1 cup-equivalent of vegetables include 2 medium carrots, 2 cups raw, leafy greens, 1 cup chopped  vegetable (raw or cooked), or 1 medium baked potato. Grains Aim to eat 6 ounce-equivalents of whole grains each day. Examples of 1 ounce-equivalent of grains include 1 slice of bread, 1 cup ready-to-eat cereal, 3 cups popcorn, or  cup cooked rice, pasta, or cereal. Meats and other proteins Aim to eat 5-6 ounce-equivalents of protein each day. Examples of 1 ounce-equivalent of protein include 1 egg, 1/2 cup nuts or seeds, or 1 tablespoon (16 g) peanut butter. A cut of meat or fish that is the size of a deck of cards is about 3-4 ounce-equivalents.  Of the protein you eat each week, try to have at least 8 ounces come from seafood. This includes salmon, trout, herring, and anchovies. Dairy Aim to eat 3 cup-equivalents of fat-free or low-fat dairy each day. Examples of 1 cup-equivalent of dairy include 1 cup (240 mL) milk, 8 ounces (250 g) yogurt, 1 ounces (44 g) natural cheese, or 1 cup (240 mL) fortified soy milk. Fats and oils  Aim for about 5 teaspoons (21 g) per day. Choose monounsaturated fats, such as canola and olive oils, avocados, peanut butter, and most nuts, or polyunsaturated fats, such as sunflower, corn, and soybean oils, walnuts, pine nuts, sesame seeds, sunflower seeds, and flaxseed. Beverages  Aim for six 8-oz glasses of water per day. Limit coffee to three to five 8-oz cups per day.  Limit caffeinated beverages that have added calories, such as soda and energy drinks.  Limit alcohol intake to no more than 1 drink a day for nonpregnant women and 2 drinks a day for men. One drink equals 12 oz of beer (355 mL), 5 oz of wine (148 mL), or 1 oz of hard liquor (44 mL). Seasoning and other foods  Avoid adding excess amounts of salt to your foods. Try flavoring foods with herbs and spices instead of salt.  Avoid adding sugar to foods.  Try using oil-based dressings, sauces, and spreads instead of solid fats. This information is based on general [place] nutrition guidelines. For more  information, visit choosemyplate.gov. Exact amounts may vary based on your nutrition needs. Summary  A healthy eating plan may help you to maintain a healthy weight, reduce the risk of chronic diseases, and stay active throughout your life.  Plan your meals. Make sure you eat the right portions of a variety of nutrient-rich foods.  Try baking, boiling, grilling, or broiling instead of frying.  Choose healthy options in all settings, including home, work, school, restaurants, or stores. This information is not intended to replace advice given to you by your health care provider. Make sure you discuss any questions you have with your health care provider. Document Released: 03/16/2018 Document Revised: 03/16/2018 Document Reviewed: 03/16/2018 Elsevier Patient Education  2020 Elsevier Inc.  

## 2019-10-12 NOTE — Progress Notes (Signed)
Subjective:     Marie Watkins is a 38 y.o. female here for a routine exam.  Current complaints: none.  Personal health questionnaire reviewed: yes.  Do you have a primary care provider? no How many times per week do you exercise? Very active at work Do you feel safe at home? yes Has anyone hit, slapped, or kicked you recently? no Do you feel sad, tired, or upset most days or are you mostly happy with life? Mostly happy    Gynecologic History Patient's last menstrual period was 09/11/2019. Contraception: IUD [place]ard Last Pap: 2019. Results were: normal Last mammogram: n/a  Obstetric History OB History  Gravida Para Term Preterm AB Living  7 5 4 1 2 5   SAB TAB Ectopic Multiple Live Births  2 0 0 0 5    # Outcome Date GA Lbr Len/2nd Weight Sex Delivery Anes PTL Lv  7 Term 06/10/15 [redacted]w[redacted]d 03:51 / 00:03 5 lb 14 oz (2.665 kg) F Vag-Spont None  LIV  6 Preterm 02/21/12 [redacted]w[redacted]d 09:55 / 00:09 5 lb 11.4 oz (2.591 kg) F Vag-Spont EPI  LIV     Birth Comments: WNL  5 Term 04/15/10 [redacted]w[redacted]d  5 lb 5 oz (2.41 kg) F CS-LTranv EPI N LIV     Complications: Breech presentation  4 Term 11/22/06 [redacted]w[redacted]d  5 lb 2 oz (2.325 kg) M Vag-Spont None N LIV  3 SAB 12/2005          2 SAB 2006     SAB     1 Term 01/30/00 [redacted]w[redacted]d  5 lb 14 oz (2.665 kg) F Vag-Spont None N LIV     The following portions of the patient's history were reviewed and updated as appropriate: allergies, current medications, past family history, past medical history, past social history, past surgical history and problem list.  Review of Systems Pertinent items noted in HPI and remainder of comprehensive ROS otherwise negative.    Objective:   BP 121/72   Pulse 88   Temp 98.1 F (36.7 C)   Ht 5\' 6"  (1.676 m)   Wt 149 lb 12.8 oz (67.9 kg)   LMP 09/11/2019   BMI 24.18 kg/m    VS reviewed, nursing note reviewed,  Constitutional: well developed, well nourished, no distress HEENT: normocephalic CV: normal rate Pulm/chest wall:  normal effort Breast Exam:  right breast normal without mass, skin or nipple changes or axillary nodes, left breast normal without mass, skin or nipple changes or axillary nodes Abdomen: soft Neuro: alert and oriented x 3 Skin: warm, dry Psych: affect normal Pelvic exam: Cervix pink, visually closed, without lesion, IUD string visible at cervical os, scant white creamy discharge, vaginal walls and external genitalia normal Bimanual exam: Cervix 0/long/high, firm, anterior, neg CMT, uterus nontender, nonenlarged, adnexa without tenderness, enlargement, or mass      Assessment/Plan:   1. Well woman exam with routine gynecological exam --Doing well, no gyn concerns --Pt had Pap in 2019 with HPV detected, repeat Pap in 1 year recommended.   - Cytology - PAP( [place])  2. Screening examination for STD (sexually transmitted disease)  - Cervicovaginal ancillary only( [place])     Follow up in: 1 year or as needed.   Fatima Blank, CNM 11:55 AM

## 2019-10-12 NOTE — Progress Notes (Addendum)
GYN presents for AEX/PAP/STD swabs.  Reports no problems today. Declined the FLU. BC is IUD-Paragard. GAD-7=0

## 2019-10-18 LAB — CYTOLOGY - PAP
Comment: NEGATIVE
Comment: NEGATIVE
Comment: NEGATIVE
Diagnosis: NEGATIVE
HPV 16: NEGATIVE
HPV 18 / 45: NEGATIVE
High risk HPV: POSITIVE — AB

## 2019-10-19 ENCOUNTER — Telehealth: Payer: Self-pay | Admitting: Advanced Practice Midwife

## 2019-10-19 LAB — CERVICOVAGINAL ANCILLARY ONLY
Bacterial Vaginitis (gardnerella): NEGATIVE
Candida Glabrata: NEGATIVE
Candida Vaginitis: POSITIVE — AB
Chlamydia: NEGATIVE
Comment: NEGATIVE
Comment: NEGATIVE
Comment: NEGATIVE
Comment: NEGATIVE
Comment: NEGATIVE
Comment: NORMAL
Neisseria Gonorrhea: NEGATIVE
Trichomonas: NEGATIVE

## 2019-10-19 NOTE — Telephone Encounter (Signed)
Pap normal with positive HPV last year, repeat Pap this year with normal cytology and positive HPV again.  Negative for HPV 16 and 18. Colposcopy recommended per ASCCP because this was a follow up from Pap 1 year ago.  Message sent to office to schedule pt for Colpo. Called pt to discuss results. Questions answered. Pt states understanding.

## 2019-10-28 ENCOUNTER — Ambulatory Visit: Payer: Managed Care, Other (non HMO) | Admitting: Obstetrics

## 2019-10-28 ENCOUNTER — Encounter: Payer: Self-pay | Admitting: Obstetrics

## 2019-10-28 ENCOUNTER — Other Ambulatory Visit: Payer: Self-pay

## 2019-10-28 VITALS — BP 124/76 | HR 78 | Wt 147.0 lb

## 2019-10-28 DIAGNOSIS — Z719 Counseling, unspecified: Secondary | ICD-10-CM

## 2019-10-28 NOTE — Progress Notes (Signed)
Pt presents for coloscopy bx.  10/12/2019 HPV High risk on pap.  Pt requests rx for yeast infection.

## 2019-10-28 NOTE — Progress Notes (Signed)
Patient presents for colposcopy for a pap smear that was NILM with negative 16 / 18 / 45 for 2 years.  A/P:  NILM with negative HPV 16 / 18.   The ASCCP Guidelines recommend co-testing in 1 year.  Discussed recommendations with patient and she agrees.  All questions answered to her satisfaction.  Colposcopy cancelled.  Shelly Bombard, MD 10/28/2019 4:02 PM

## 2019-10-29 ENCOUNTER — Telehealth: Payer: Self-pay

## 2019-10-29 ENCOUNTER — Other Ambulatory Visit: Payer: Self-pay

## 2019-10-29 DIAGNOSIS — B373 Candidiasis of vulva and vagina: Secondary | ICD-10-CM

## 2019-10-29 DIAGNOSIS — B3731 Acute candidiasis of vulva and vagina: Secondary | ICD-10-CM

## 2019-10-29 MED ORDER — FLUCONAZOLE 150 MG PO TABS: 150.0000 mg | ORAL_TABLET | Freq: Once | ORAL | 0 refills | Status: AC

## 2019-10-29 NOTE — Telephone Encounter (Signed)
Yeast Rx was not sent in for pt as discussed at yesterday's visit Rx sent today.   Pt not ava to make aware left detailed message on pt vm

## 2019-10-29 NOTE — Progress Notes (Signed)
Rx was not sent yesterday Rx sent today. Pt not ava lmovm

## 2020-01-07 ENCOUNTER — Other Ambulatory Visit: Payer: Self-pay | Admitting: *Deleted

## 2020-01-07 MED ORDER — FLUCONAZOLE 150 MG PO TABS: 150.0000 mg | ORAL_TABLET | Freq: Once | ORAL | 0 refills | Status: AC

## 2020-01-07 NOTE — Progress Notes (Signed)
Pt called to office with symptoms of yeast inf, ask for tx.  Diflucan sent per protocol.

## 2020-01-13 ENCOUNTER — Ambulatory Visit: Payer: Managed Care, Other (non HMO) | Admitting: Obstetrics

## 2020-01-19 ENCOUNTER — Ambulatory Visit: Payer: Managed Care, Other (non HMO) | Admitting: Obstetrics

## 2020-01-20 ENCOUNTER — Encounter: Payer: Self-pay | Admitting: Obstetrics

## 2020-01-20 ENCOUNTER — Ambulatory Visit (INDEPENDENT_AMBULATORY_CARE_PROVIDER_SITE_OTHER): Payer: Managed Care, Other (non HMO) | Admitting: Obstetrics

## 2020-01-20 ENCOUNTER — Other Ambulatory Visit: Payer: Self-pay

## 2020-01-20 VITALS — BP 120/72 | HR 90 | Ht 65.0 in | Wt 149.7 lb

## 2020-01-20 DIAGNOSIS — N898 Other specified noninflammatory disorders of vagina: Secondary | ICD-10-CM

## 2020-01-20 DIAGNOSIS — Z131 Encounter for screening for diabetes mellitus: Secondary | ICD-10-CM

## 2020-01-20 DIAGNOSIS — Z113 Encounter for screening for infections with a predominantly sexual mode of transmission: Secondary | ICD-10-CM | POA: Diagnosis not present

## 2020-01-20 DIAGNOSIS — B9689 Other specified bacterial agents as the cause of diseases classified elsewhere: Secondary | ICD-10-CM | POA: Diagnosis not present

## 2020-01-20 DIAGNOSIS — N760 Acute vaginitis: Secondary | ICD-10-CM | POA: Diagnosis not present

## 2020-01-20 DIAGNOSIS — B373 Candidiasis of vulva and vagina: Secondary | ICD-10-CM

## 2020-01-20 DIAGNOSIS — B3731 Acute candidiasis of vulva and vagina: Secondary | ICD-10-CM

## 2020-01-20 DIAGNOSIS — D508 Other iron deficiency anemias: Secondary | ICD-10-CM

## 2020-01-20 NOTE — Progress Notes (Signed)
Patient is in the office for std check, reports vaginal irritation.

## 2020-01-20 NOTE — Progress Notes (Addendum)
Patient ID: Marie Watkins, female   DOB: 02/20/1981, 39 y.o.   MRN: [id number]  Chief Complaint  Patient presents with  . GYN    HPI Marie Watkins is a 39 y.o. female.  Vaginal discharge with itching.  Has a history of chronic yeast infections. HPI  Past Medical History:  Diagnosis Date  . Abnormal Pap smear   . Medical history non-contributory   . No pertinent past medical history     Past Surgical History:  Procedure Laterality Date  . CESAREAN SECTION    . COLPOSCOPY    . DILATION AND CURETTAGE OF UTERUS    . TONSILLECTOMY      Family History  Family history unknown: Yes    Social History Social History   Tobacco Use  . Smoking status: Never Smoker  . Smokeless tobacco: Never Used  Substance Use Topics  . Alcohol use: No    Alcohol/week: 0.0 standard drinks  . Drug use: No    No Known Allergies  Current Outpatient Medications  Medication Sig Dispense Refill  . PARAGARD INTRAUTERINE COPPER IU 1 Device by Intrauterine route.    . nitrofurantoin, macrocrystal-monohydrate, (MACROBID) 100 MG capsule Take 1 capsule (100 mg total) by mouth 2 (two) times daily. 1 po BID x 7days (Patient not taking: Reported on 08/16/2019) 14 capsule 2   No current facility-administered medications for this visit.    Review of Systems Review of Systems Constitutional: negative for fatigue and weight loss Respiratory: negative for cough and wheezing Cardiovascular: negative for chest pain, fatigue and palpitations Gastrointestinal: negative for abdominal pain and change in bowel habits Genitourinary:positive for vaginal discharge with itching Integument/breast: negative for nipple discharge Musculoskeletal:negative for myalgias Neurological: negative for gait problems and tremors Behavioral/Psych: negative for abusive relationship, depression Endocrine: negative for temperature intolerance      Blood pressure 120/72, pulse 90, height 5\' 5"  (1.651 m), weight 149 lb 11.2 oz  (67.9 kg).  Physical Exam Physical Exam           General:  Alert and no distress Abdomen:  normal findings: no organomegaly, soft, non-tender and no hernia  Pelvis:  External genitalia: normal general appearance Urinary system: urethral meatus normal and bladder without fullness, nontender Vaginal: normal without tenderness, induration or masses Cervix: normal appearance Adnexa: normal bimanual exam Uterus: anteverted and non-tender, normal size    50% of 15 min visit spent on counseling and coordination of care.   Data Reviewed Wet Prep CBC  Assessment     1. Candida vaginitis, chronic Rx: - Cervicovaginal ancillary only( [place]) - Boric Acid 600 mg suppositories ( OTC )   # 30  Sig: Insert 1 suppository intravaginally at bedtime for 2 weeks prn  2. Screening for diabetes mellitus (DM) Rx: - Hemoglobin A1c  3. Iron deficiency anemia secondary to inadequate dietary iron intake Rx: - CBC - TSH - Ferritin    Plan    Follow up prn  Orders Placed This Encounter  Procedures  . Hemoglobin A1c  . CBC  . TSH  . Ferritin       , MD 01/20/2020 4:11 PM

## 2020-01-21 ENCOUNTER — Other Ambulatory Visit: Payer: Self-pay | Admitting: Obstetrics

## 2020-01-21 DIAGNOSIS — D508 Other iron deficiency anemias: Secondary | ICD-10-CM

## 2020-01-21 LAB — CERVICOVAGINAL ANCILLARY ONLY
Bacterial Vaginitis (gardnerella): POSITIVE — AB
Candida Glabrata: NEGATIVE
Candida Vaginitis: NEGATIVE
Chlamydia: NEGATIVE
Comment: NEGATIVE
Comment: NEGATIVE
Comment: NEGATIVE
Comment: NEGATIVE
Comment: NEGATIVE
Comment: NORMAL
Neisseria Gonorrhea: NEGATIVE
Trichomonas: NEGATIVE

## 2020-01-21 LAB — FERRITIN: Ferritin: 5 ng/mL — ABNORMAL LOW (ref 15–150)

## 2020-01-21 LAB — CBC
Hematocrit: 31.9 % — ABNORMAL LOW (ref 34.0–46.6)
Hemoglobin: 9.3 g/dL — ABNORMAL LOW (ref 11.1–15.9)
MCH: 21.2 pg — ABNORMAL LOW (ref 26.6–33.0)
MCHC: 29.2 g/dL — ABNORMAL LOW (ref 31.5–35.7)
MCV: 73 fL — ABNORMAL LOW (ref 79–97)
Platelets: 363 10*3/uL (ref 150–450)
RBC: 4.39 x10E6/uL (ref 3.77–5.28)
RDW: 15.4 % (ref 11.7–15.4)
WBC: 7.3 10*3/uL (ref 3.4–10.8)

## 2020-01-21 LAB — HEMOGLOBIN A1C
Est. average glucose Bld gHb Est-mCnc: 114 mg/dL
Hgb A1c MFr Bld: 5.6 % (ref 4.8–5.6)

## 2020-01-21 LAB — TSH: TSH: 0.515 u[IU]/mL (ref 0.450–4.500)

## 2020-01-21 MED ORDER — FERROUS SULFATE 325 (65 FE) MG PO TABS: 325.0000 mg | ORAL_TABLET | Freq: Two times a day (BID) | ORAL | 11 refills | Status: DC

## 2020-01-21 MED ORDER — VITAFOL ULTRA 29-0.6-0.4-200 MG PO CAPS: 1.0000 | ORAL_CAPSULE | Freq: Every day | ORAL | 11 refills | Status: DC

## 2020-01-22 ENCOUNTER — Other Ambulatory Visit: Payer: Self-pay | Admitting: Obstetrics

## 2020-01-22 DIAGNOSIS — N760 Acute vaginitis: Secondary | ICD-10-CM

## 2020-01-22 DIAGNOSIS — B9689 Other specified bacterial agents as the cause of diseases classified elsewhere: Secondary | ICD-10-CM

## 2020-01-22 MED ORDER — TINIDAZOLE 500 MG PO TABS: 1000.0000 mg | ORAL_TABLET | Freq: Every day | ORAL | 2 refills | Status: DC

## 2020-10-26 ENCOUNTER — Ambulatory Visit: Payer: Managed Care, Other (non HMO) | Admitting: Obstetrics

## 2020-11-14 ENCOUNTER — Other Ambulatory Visit: Payer: Self-pay

## 2020-11-14 ENCOUNTER — Ambulatory Visit (INDEPENDENT_AMBULATORY_CARE_PROVIDER_SITE_OTHER): Payer: Managed Care, Other (non HMO) | Admitting: Obstetrics

## 2020-11-14 ENCOUNTER — Encounter: Payer: Self-pay | Admitting: Obstetrics

## 2020-11-14 ENCOUNTER — Other Ambulatory Visit (HOSPITAL_COMMUNITY)
Admission: RE | Admit: 2020-11-14 | Discharge: 2020-11-14 | Disposition: A | Discharge status: Home / Self Care | Payer: Managed Care, Other (non HMO) | Source: Ambulatory Visit | Attending: Obstetrics | Admitting: Obstetrics

## 2020-11-14 VITALS — BP 120/69 | HR 82 | Ht 65.0 in | Wt 145.0 lb

## 2020-11-14 DIAGNOSIS — Z01419 Encounter for gynecological examination (general) (routine) without abnormal findings: Secondary | ICD-10-CM | POA: Insufficient documentation

## 2020-11-14 DIAGNOSIS — N898 Other specified noninflammatory disorders of vagina: Secondary | ICD-10-CM

## 2020-11-14 DIAGNOSIS — Z8619 Personal history of other infectious and parasitic diseases: Secondary | ICD-10-CM | POA: Diagnosis not present

## 2020-11-14 DIAGNOSIS — Z113 Encounter for screening for infections with a predominantly sexual mode of transmission: Secondary | ICD-10-CM

## 2020-11-14 NOTE — Progress Notes (Signed)
GYN presents for AEX/PAP/STD screening. Reports no problems today.  Last PAP 10/12/2019  PHQ-9=0

## 2020-11-14 NOTE — Progress Notes (Signed)
Subjective:   Vaginal yeast infections     Marie Watkins is a 39 y.o. female here for a routine exam.  Current complaints: Recurrent vaginal yeast infections .    Personal health questionnaire:  Is patient [name], have a family history of breast and/or ovarian cancer: unknown Is there a family history of uterine cancer diagnosed at age < 60, gastrointestinal cancer, urinary tract cancer, family member who is a Personnel officer syndrome-associated carrier: unknown Is the patient overweight and hypertensive, family history of diabetes, personal history of gestational diabetes, preeclampsia or PCOS: unknown Is patient over 62, have PCOS,  family history of premature CHD under age 2, diabetes, smoke, have hypertension or peripheral artery disease:  unknown At any time, has a partner hit, kicked or otherwise hurt or frightened you?: no Over the past 2 weeks, have you felt down, depressed or hopeless?: no Over the past 2 weeks, have you felt little interest or pleasure in doing things?:no   Gynecologic History Patient's last menstrual period was 09/29/2020. Contraception: IUD Conservation officer, historic buildings ) Last Pap: 20-27-2020  Results were: NILM with positive HRHPV and yeast Last mammogram: n/a. Results were: n/a  Obstetric History OB History  Gravida Para Term Preterm AB Living  7 5 4 1 2 5   SAB TAB Ectopic Multiple Live Births  2 0 0 0 5    # Outcome Date GA Lbr Len/2nd Weight Sex Delivery Anes PTL Lv  7 Term 06/10/15 [redacted]w[redacted]d 03:51 / 00:03 5 lb 14 oz (2.665 kg) F Vag-Spont None  LIV  6 Preterm 02/21/12 [redacted]w[redacted]d 09:55 / 00:09 5 lb 11.4 oz (2.591 kg) F Vag-Spont EPI  LIV     Birth Comments: WNL  5 Term 04/15/10 [redacted]w[redacted]d  5 lb 5 oz (2.41 kg) F CS-LTranv EPI N LIV     Complications: Breech presentation  4 Term 11/22/06 [redacted]w[redacted]d  5 lb 2 oz (2.325 kg) M Vag-Spont None N LIV  3 SAB 12/2005          2 SAB 2006     SAB     1 Term 01/30/00 [redacted]w[redacted]d  5 lb 14 oz (2.665 kg) F Vag-Spont None N LIV    Past Medical  History:  Diagnosis Date  . Abnormal Pap smear   . Medical history non-contributory   . No pertinent past medical history     Past Surgical History:  Procedure Laterality Date  . CESAREAN SECTION    . COLPOSCOPY    . DILATION AND CURETTAGE OF UTERUS    . TONSILLECTOMY       Current Outpatient Medications:  .  ferrous sulfate 325 (65 FE) MG tablet, Take 1 tablet (325 mg total) by mouth 2 (two) times daily with a meal., Disp: 60 tablet, Rfl: 11 .  nitrofurantoin, macrocrystal-monohydrate, (MACROBID) 100 MG capsule, Take 1 capsule (100 mg total) by mouth 2 (two) times daily. 1 po BID x 7days (Patient not taking: Reported on 08/16/2019), Disp: 14 capsule, Rfl: 2 .  PARAGARD INTRAUTERINE COPPER IU, 1 Device by Intrauterine route., Disp: , Rfl:  .  Prenat-Fe Poly-Methfol-FA-DHA (VITAFOL ULTRA) 29-0.6-0.4-200 MG CAPS, Take 1 capsule by mouth daily before breakfast. (Patient not taking: Reported on 11/14/2020), Disp: 30 capsule, Rfl: 11 .  tinidazole (TINDAMAX) 500 MG tablet, Take 2 tablets (1,000 mg total) by mouth daily with breakfast. (Patient not taking: Reported on 11/14/2020), Disp: 10 tablet, Rfl: 2 No Known Allergies  Social History   Tobacco Use  . Smoking status: Never Smoker  .  Smokeless tobacco: Never Used  Substance Use Topics  . Alcohol use: No    Alcohol/week: 0.0 standard drinks    Family History  Family history unknown: Yes      Review of Systems  Constitutional: negative for fatigue and weight loss Respiratory: negative for cough and wheezing Cardiovascular: negative for chest pain, fatigue and palpitations Gastrointestinal: negative for abdominal pain and change in bowel habits Musculoskeletal:negative for myalgias Neurological: negative for gait problems and tremors Behavioral/Psych: negative for abusive relationship, depression Endocrine: negative for temperature intolerance    Genitourinary:negative for abnormal menstrual periods, genital lesions, hot  flashes, sexual problems and vaginal discharge Integument/breast: negative for breast lump, breast tenderness, nipple discharge and skin lesion(s)    Objective:        BP 120/69   Pulse 82   Ht 5\' 5"  (1.651 m)   Wt 145 lb (65.8 kg)   LMP 09/29/2020   BMI 24.13 kg/m  General:   alert and no distress  Skin:   no rash or abnormalities  Lungs:   clear to auscultation bilaterally  Heart:   regular rate and rhythm, S1, S2 normal, no murmur, click, rub or gallop  Breasts:   normal without suspicious masses, skin or nipple changes or axillary nodes  Abdomen:  normal findings: no organomegaly, soft, non-tender and no hernia  Pelvis:  External genitalia: normal general appearance Urinary system: urethral meatus normal and bladder without fullness, nontender Vaginal: normal without tenderness, induration or masses Cervix: normal appearance.  IUD string visible. Adnexa: normal bimanual exam Uterus: anteverted and non-tender, normal size   Lab Review Urine pregnancy test Labs reviewed yes Radiologic studies reviewed no  50% of 20 min visit spent on counseling and coordination of care.   Assessment:     1. Encounter for gynecological examination with Papanicolaou smear of cervix Rx: - Cytology - PAP( [place])  2. Vaginal discharge Rx: - Cervicovaginal ancillary only( [place])  3. History of candidal vulvovaginitis - precautions given for not douching except for vinegar-water  4. Screening for STD (sexually transmitted disease) Rx: - HIV antibody (with reflex) - RPR - Hepatitis B surface antigen - Hepatitis C Antibody    Plan:    Education reviewed: calcium supplements, depression evaluation, low fat, low cholesterol diet, safe sex/STD prevention, self breast exams and weight bearing exercise. Contraception: IUD. Follow up in: 1 year.    Orders Placed This Encounter  Procedures  . HIV antibody (with reflex)  . RPR  . Hepatitis B surface antigen  . Hepatitis  C Antibody    10/01/2020, MD 11/14/2020 12:15 PM

## 2020-11-15 LAB — CERVICOVAGINAL ANCILLARY ONLY
Bacterial Vaginitis (gardnerella): NEGATIVE
Candida Glabrata: NEGATIVE
Candida Vaginitis: POSITIVE — AB
Chlamydia: NEGATIVE
Comment: NEGATIVE
Comment: NEGATIVE
Comment: NEGATIVE
Comment: NEGATIVE
Comment: NEGATIVE
Comment: NORMAL
Neisseria Gonorrhea: NEGATIVE
Trichomonas: NEGATIVE

## 2020-11-15 LAB — HIV ANTIBODY (ROUTINE TESTING W REFLEX): HIV Screen 4th Generation wRfx: NONREACTIVE

## 2020-11-15 LAB — HEPATITIS B SURFACE ANTIGEN: Hepatitis B Surface Ag: NEGATIVE

## 2020-11-15 LAB — HEPATITIS C ANTIBODY: Hep C Virus Ab: 0.2 s/co ratio (ref 0.0–0.9)

## 2020-11-15 LAB — RPR: RPR Ser Ql: NONREACTIVE

## 2020-11-16 ENCOUNTER — Other Ambulatory Visit: Payer: Self-pay | Admitting: Obstetrics

## 2020-11-16 DIAGNOSIS — B3731 Acute candidiasis of vulva and vagina: Secondary | ICD-10-CM

## 2020-11-16 DIAGNOSIS — B373 Candidiasis of vulva and vagina: Secondary | ICD-10-CM

## 2020-11-16 MED ORDER — FLUCONAZOLE 150 MG PO TABS: 150.0000 mg | ORAL_TABLET | Freq: Once | ORAL | 0 refills | Status: AC

## 2020-11-20 LAB — CYTOLOGY - PAP
Comment: NEGATIVE
Comment: NEGATIVE
HPV 16: NEGATIVE
HPV 18 / 45: NEGATIVE
High risk HPV: POSITIVE — AB

## 2020-11-21 ENCOUNTER — Telehealth: Payer: Self-pay | Admitting: *Deleted

## 2020-11-21 NOTE — Telephone Encounter (Signed)
-----   Message from Brock Bad, MD sent at 11/20/2020 12:55 PM EST ----- LGSIL pap smear.  Schedule colposcopy.

## 2020-12-18 ENCOUNTER — Encounter: Payer: Self-pay | Admitting: Obstetrics

## 2020-12-18 ENCOUNTER — Other Ambulatory Visit: Payer: Self-pay

## 2020-12-18 ENCOUNTER — Other Ambulatory Visit (HOSPITAL_COMMUNITY)
Admission: RE | Admit: 2020-12-18 | Discharge: 2020-12-18 | Disposition: A | Discharge status: Home / Self Care | Payer: Managed Care, Other (non HMO) | Source: Ambulatory Visit | Attending: Obstetrics | Admitting: Obstetrics

## 2020-12-18 ENCOUNTER — Ambulatory Visit (INDEPENDENT_AMBULATORY_CARE_PROVIDER_SITE_OTHER): Payer: Self-pay | Admitting: Obstetrics

## 2020-12-18 VITALS — BP 130/84 | HR 96 | Wt 148.3 lb

## 2020-12-18 DIAGNOSIS — R87612 Low grade squamous intraepithelial lesion on cytologic smear of cervix (LGSIL): Secondary | ICD-10-CM

## 2020-12-18 LAB — POCT URINE PREGNANCY: Preg Test, Ur: NEGATIVE

## 2020-12-18 NOTE — Progress Notes (Signed)
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 The prior pap showed NILM with positive HRHPV.  Prior cervical/vaginal disease: normal exam without visible pathology. Prior cervical treatment: no treatment.  Procedure Details  The risks and benefits of the procedure and Written informed consent obtained.  A time-out was performed confirming the patient, procedure and allergy status  Speculum placed in vagina and excellent visualization of cervix achieved, cervix swabbed x 3 with acetic acid solution.  Findings: Cervix: no visible lesions, no mosaicism, no punctation and no abnormal vasculature; SCJ visualized 360 degrees without lesions, endocervical curettage performed, cervical biopsies taken at 6,9, and 12 o'clock, specimen labelled and sent to pathology and hemostasis achieved with silver nitrate.   Vaginal inspection: normal without visible lesions. Vulvar colposcopy: vulvar colposcopy not performed.   Physical Exam   Specimens: ECC and Cervical Biopsies  Complications: none.  Plan: Specimens labelled and sent to Pathology. Will base further treatment on Pathology findings. Treatment options discussed with patient. Post biopsy instructions given to patient. Return to discuss Pathology results in 2 weeks.   Brock Bad, MD 12/18/2020 10:44 AM

## 2020-12-18 NOTE — Progress Notes (Signed)
Pt presents for colpo s/p LGSIL HR HPV pap on 11/14/20 UPT - negative

## 2020-12-20 LAB — SURGICAL PATHOLOGY

## 2021-04-03 ENCOUNTER — Telehealth: Payer: Self-pay | Admitting: Obstetrics and Gynecology

## 2021-04-03 MED ORDER — FLUCONAZOLE 150 MG PO TABS: 150.0000 mg | ORAL_TABLET | Freq: Once | ORAL | 0 refills | Status: AC

## 2021-04-03 NOTE — Telephone Encounter (Signed)
Patient called requesting rx for a yeast infection.  Prescription routed per protocol.

## 2021-11-16 ENCOUNTER — Other Ambulatory Visit: Payer: Self-pay

## 2021-11-16 ENCOUNTER — Ambulatory Visit (INDEPENDENT_AMBULATORY_CARE_PROVIDER_SITE_OTHER): Payer: 59 | Admitting: Obstetrics

## 2021-11-16 ENCOUNTER — Other Ambulatory Visit (HOSPITAL_COMMUNITY)
Admission: RE | Admit: 2021-11-16 | Discharge: 2021-11-16 | Disposition: A | Discharge status: Home / Self Care | Payer: 59 | Source: Ambulatory Visit | Attending: Obstetrics | Admitting: Obstetrics

## 2021-11-16 ENCOUNTER — Encounter: Payer: Self-pay | Admitting: Obstetrics

## 2021-11-16 VITALS — BP 132/79 | HR 101 | Ht 65.0 in | Wt 145.9 lb

## 2021-11-16 DIAGNOSIS — Z01419 Encounter for gynecological examination (general) (routine) without abnormal findings: Secondary | ICD-10-CM

## 2021-11-16 DIAGNOSIS — D508 Other iron deficiency anemias: Secondary | ICD-10-CM

## 2021-11-16 DIAGNOSIS — N946 Dysmenorrhea, unspecified: Secondary | ICD-10-CM | POA: Diagnosis not present

## 2021-11-16 DIAGNOSIS — R87612 Low grade squamous intraepithelial lesion on cytologic smear of cervix (LGSIL): Secondary | ICD-10-CM

## 2021-11-16 MED ORDER — VITAFOL FE+ 90-0.6-0.4-200 MG PO CAPS: 1.0000 | ORAL_CAPSULE | Freq: Every day | ORAL | 4 refills | Status: DC

## 2021-11-16 MED ORDER — IBUPROFEN 800 MG PO TABS: 800.0000 mg | ORAL_TABLET | Freq: Three times a day (TID) | ORAL | 5 refills | Status: DC | PRN

## 2021-11-16 NOTE — Progress Notes (Signed)
Subjective:        Marie Watkins is a 40 y.o. female here for a routine exam.  Current complaints: None.    Personal health questionnaire:  Is patient [name], have a family history of breast and/or ovarian cancer: no Is there a family history of uterine cancer diagnosed at age < 17, gastrointestinal cancer, urinary tract cancer, family member who is a Personnel officer syndrome-associated carrier: no Is the patient overweight and hypertensive, family history of diabetes, personal history of gestational diabetes, preeclampsia or PCOS: no Is patient over 62, have PCOS,  family history of premature CHD under age 32, diabetes, smoke, have hypertension or peripheral artery disease:  no At any time, has a partner hit, kicked or otherwise hurt or frightened you?: no Over the past 2 weeks, have you felt down, depressed or hopeless?: no Over the past 2 weeks, have you felt little interest or pleasure in doing things?:no   Gynecologic History No LMP recorded. (Menstrual status: IUD). Contraception: ParaGuard IUD Last Pap: 11-14-2020. Results were: LGSIL Last mammogram: n/a. Results were: n/a  Obstetric History OB History  Gravida Para Term Preterm AB Living  7 5 4 1 2 5   SAB IAB Ectopic Multiple Live Births  2 0 0 0 5    # Outcome Date GA Lbr Len/2nd Weight Sex Delivery Anes PTL Lv  7 Term 06/10/15 [redacted]w[redacted]d 03:51 / 00:03 5 lb 14 oz (2.665 kg) F Vag-Spont None  LIV  6 Preterm 02/21/12 [redacted]w[redacted]d 09:55 / 00:09 5 lb 11.4 oz (2.591 kg) F Vag-Spont EPI  LIV     Birth Comments: WNL  5 Term 04/15/10 [redacted]w[redacted]d  5 lb 5 oz (2.41 kg) F CS-LTranv EPI N LIV     Complications: Breech presentation  4 Term 11/22/06 [redacted]w[redacted]d  5 lb 2 oz (2.325 kg) M Vag-Spont None N LIV  3 SAB 12/2005          2 SAB 2006     SAB     1 Term 01/30/00 [redacted]w[redacted]d  5 lb 14 oz (2.665 kg) F Vag-Spont None N LIV    Past Medical History:  Diagnosis Date   Abnormal Pap smear    Medical history non-contributory    No pertinent past medical  history     Past Surgical History:  Procedure Laterality Date   CESAREAN SECTION     COLPOSCOPY     DILATION AND CURETTAGE OF UTERUS     TONSILLECTOMY       Current Outpatient Medications:    ibuprofen (ADVIL) 800 MG tablet, Take 1 tablet (800 mg total) by mouth every 8 (eight) hours as needed., Disp: 30 tablet, Rfl: 5   Prenat-Fe Poly-Methfol-FA-DHA (VITAFOL FE+) 90-0.6-0.4-200 MG CAPS, Take 1 capsule by mouth daily before breakfast., Disp: 90 capsule, Rfl: 4   nitrofurantoin, macrocrystal-monohydrate, (MACROBID) 100 MG capsule, Take 1 capsule (100 mg total) by mouth 2 (two) times daily. 1 po BID x 7days (Patient not taking: No sig reported), Disp: 14 capsule, Rfl: 2   PARAGARD INTRAUTERINE COPPER IU, 1 Device by Intrauterine route. (Patient not taking: Reported on 12/18/2020), Disp: , Rfl:    tinidazole (TINDAMAX) 500 MG tablet, Take 2 tablets (1,000 mg total) by mouth daily with breakfast. (Patient not taking: No sig reported), Disp: 10 tablet, Rfl: 2 No Known Allergies  Social History   Tobacco Use   Smoking status: Never   Smokeless tobacco: Never  Substance Use Topics   Alcohol use: No    Alcohol/week:  0.0 standard drinks    Family History  Family history unknown: Yes      Review of Systems  Constitutional: negative for fatigue and weight loss Respiratory: negative for cough and wheezing Cardiovascular: negative for chest pain, fatigue and palpitations Gastrointestinal: negative for abdominal pain and change in bowel habits Musculoskeletal:negative for myalgias Neurological: negative for gait problems and tremors Behavioral/Psych: negative for abusive relationship, depression Endocrine: negative for temperature intolerance    Genitourinary:negative for abnormal menstrual periods, genital lesions, hot flashes, sexual problems and vaginal discharge Integument/breast: negative for breast lump, breast tenderness, nipple discharge and skin lesion(s)    Objective:        BP 132/79   Pulse (!) 101   Ht 5\' 5"  (1.651 m)   Wt 145 lb 14.4 oz (66.2 kg)   BMI 24.28 kg/m  General:   alert  Skin:   no rash or abnormalities  Lungs:   clear to auscultation bilaterally  Heart:   regular rate and rhythm, S1, S2 normal, no murmur, click, rub or gallop  Breasts:   normal without suspicious masses, skin or nipple changes or axillary nodes  Abdomen:  normal findings: no organomegaly, soft, non-tender and no hernia  Pelvis:  External genitalia: normal general appearance Urinary system: urethral meatus normal and bladder without fullness, nontender Vaginal: normal without tenderness, induration or masses Cervix: normal appearance Adnexa: normal bimanual exam Uterus: anteverted and non-tender, normal size   Lab Review Urine pregnancy test Labs reviewed yes Radiologic studies reviewed no  I have spent a total of 20 minutes of face-to-face time, excluding clinical staff time, reviewing notes and preparing to see patient, ordering tests and/or medications, and counseling the patient.   Assessment:    1. Encounter for routine gynecological examination with Papanicolaou smear of cervix Rx: - Cytology - PAP  2. LGSIL on Pap smear of cervix - colpo agrees with pap  3. Dysmenorrhea Rx: - ibuprofen (ADVIL) 800 MG tablet; Take 1 tablet (800 mg total) by mouth every 8 (eight) hours as needed.  Dispense: 30 tablet; Refill: 5  4. Iron deficiency anemia secondary to inadequate dietary iron intake Rx: - Prenat-Fe Poly-Methfol-FA-DHA (VITAFOL FE+) 90-0.6-0.4-200 MG CAPS; Take 1 capsule by mouth daily before breakfast.  Dispense: 90 capsule; Refill: 4     Plan:    Education reviewed: calcium supplements, depression evaluation, low fat, low cholesterol diet, safe sex/STD prevention, self breast exams, and weight bearing exercise. Contraception: IUD. Follow up in: 1 year.   Meds ordered this encounter  Medications   Prenat-Fe Poly-Methfol-FA-DHA (VITAFOL FE+)  90-0.6-0.4-200 MG CAPS    Sig: Take 1 capsule by mouth daily before breakfast.    Dispense:  90 capsule    Refill:  4   ibuprofen (ADVIL) 800 MG tablet    Sig: Take 1 tablet (800 mg total) by mouth every 8 (eight) hours as needed.    Dispense:  30 tablet    Refill:  5      [birth year]1987, MD 11/16/2021 10:02 AM

## 2021-11-21 LAB — CYTOLOGY - PAP
Comment: NEGATIVE
Diagnosis: UNDETERMINED — AB
High risk HPV: NEGATIVE

## 2022-08-28 ENCOUNTER — Other Ambulatory Visit: Payer: Self-pay | Admitting: Emergency Medicine

## 2022-08-28 DIAGNOSIS — N898 Other specified noninflammatory disorders of vagina: Secondary | ICD-10-CM

## 2022-08-28 MED ORDER — FLUCONAZOLE 150 MG PO TABS: 150.0000 mg | ORAL_TABLET | Freq: Once | ORAL | 0 refills | Status: AC

## 2022-08-28 NOTE — Progress Notes (Signed)
C/o vaginal itching, chunky white discharge. Rx for diflucan sent per protocol.

## 2022-12-17 ENCOUNTER — Ambulatory Visit (INDEPENDENT_AMBULATORY_CARE_PROVIDER_SITE_OTHER): Payer: Self-pay | Admitting: Obstetrics

## 2022-12-17 ENCOUNTER — Encounter: Payer: Self-pay | Admitting: Obstetrics

## 2022-12-17 ENCOUNTER — Other Ambulatory Visit (HOSPITAL_COMMUNITY)
Admission: RE | Admit: 2022-12-17 | Discharge: 2022-12-17 | Disposition: A | Discharge status: Home / Self Care | Payer: Self-pay | Source: Ambulatory Visit | Attending: Obstetrics | Admitting: Obstetrics

## 2022-12-17 VITALS — BP 141/87 | HR 97 | Ht 65.5 in | Wt 162.6 lb

## 2022-12-17 DIAGNOSIS — Z113 Encounter for screening for infections with a predominantly sexual mode of transmission: Secondary | ICD-10-CM

## 2022-12-17 DIAGNOSIS — Z975 Presence of (intrauterine) contraceptive device: Secondary | ICD-10-CM

## 2022-12-17 DIAGNOSIS — N898 Other specified noninflammatory disorders of vagina: Secondary | ICD-10-CM | POA: Insufficient documentation

## 2022-12-17 DIAGNOSIS — Z01419 Encounter for gynecological examination (general) (routine) without abnormal findings: Secondary | ICD-10-CM

## 2022-12-17 DIAGNOSIS — N946 Dysmenorrhea, unspecified: Secondary | ICD-10-CM

## 2022-12-17 MED ORDER — IBUPROFEN 800 MG PO TABS: 800.0000 mg | ORAL_TABLET | Freq: Three times a day (TID) | ORAL | 5 refills | Status: AC | PRN

## 2022-12-17 NOTE — Progress Notes (Signed)
Patient presents for AEX. Patient denies having any concerns today.  Last pap: 11/16/21 +ASCUS -HPV

## 2022-12-17 NOTE — Progress Notes (Signed)
Subjective:        Marie Watkins is a 42 y.o. female here for a routine exam.  Current complaints: Vaginal discharge.    Personal health questionnaire:  Is patient [name], have a family history of breast and/or ovarian cancer: no Is there a family history of uterine cancer diagnosed at age < 37, gastrointestinal cancer, urinary tract cancer, family member who is a Field seismologist syndrome-associated carrier: no Is the patient overweight and hypertensive, family history of diabetes, personal history of gestational diabetes, preeclampsia or PCOS: no Is patient over 72, have PCOS,  family history of premature CHD under age 65, diabetes, smoke, have hypertension or peripheral artery disease:  no At any time, has a partner hit, kicked or otherwise hurt or frightened you?: no Over the past 2 weeks, have you felt down, depressed or hopeless?: no Over the past 2 weeks, have you felt little interest or pleasure in doing things?:no   Gynecologic History No LMP recorded. (Menstrual status: IUD). Contraception: IUD Last Pap: `11/16/2021. Results were: normal Last mammogram: n/a. Results were: n/a  Obstetric History OB History  Gravida Para Term Preterm AB Living  7 5 4 1 2 5   SAB IAB Ectopic Multiple Live Births  2 0 0 0 5    # Outcome Date GA Lbr Len/2nd Weight Sex Delivery Anes PTL Lv  7 Term 06/10/15 [redacted]w[redacted]d 03:51 / 00:03 5 lb 14 oz (2.665 kg) F Vag-Spont None  LIV  6 Preterm 02/21/12 [redacted]w[redacted]d 09:55 / 00:09 5 lb 11.4 oz (2.591 kg) F Vag-Spont EPI  LIV     Birth Comments: WNL  5 Term 04/15/10 [redacted]w[redacted]d  5 lb 5 oz (2.41 kg) F CS-LTranv EPI N LIV     Complications: Breech presentation  4 Term 11/22/06 [redacted]w[redacted]d  5 lb 2 oz (2.325 kg) M Vag-Spont None N LIV  3 SAB 12/2005          2 SAB 2006     SAB     1 Term 01/30/00 [redacted]w[redacted]d  5 lb 14 oz (2.665 kg) F Vag-Spont None N LIV    Past Medical History:  Diagnosis Date   Abnormal Pap smear    Medical history non-contributory    No pertinent past  medical history     Past Surgical History:  Procedure Laterality Date   CESAREAN SECTION     COLPOSCOPY     DILATION AND CURETTAGE OF UTERUS     TONSILLECTOMY       Current Outpatient Medications:    PARAGARD INTRAUTERINE COPPER IU, 1 Device by Intrauterine route., Disp: , Rfl:    ibuprofen (ADVIL) 800 MG tablet, Take 1 tablet (800 mg total) by mouth every 8 (eight) hours as needed., Disp: 30 tablet, Rfl: 5   nitrofurantoin, macrocrystal-monohydrate, (MACROBID) 100 MG capsule, Take 1 capsule (100 mg total) by mouth 2 (two) times daily. 1 po BID x 7days, Disp: 14 capsule, Rfl: 2   Prenat-Fe Poly-Methfol-FA-DHA (VITAFOL FE+) 90-0.6-0.4-200 MG CAPS, Take 1 capsule by mouth daily before breakfast., Disp: 90 capsule, Rfl: 4   tinidazole (TINDAMAX) 500 MG tablet, Take 2 tablets (1,000 mg total) by mouth daily with breakfast., Disp: 10 tablet, Rfl: 2 No Known Allergies  Social History   Tobacco Use   Smoking status: Never   Smokeless tobacco: Never  Substance Use Topics   Alcohol use: No    Alcohol/week: 0.0 standard drinks of alcohol    Family History  Family history unknown: Yes  Review of Systems  Constitutional: negative for fatigue and weight loss Respiratory: negative for cough and wheezing Cardiovascular: negative for chest pain, fatigue and palpitations Gastrointestinal: negative for abdominal pain and change in bowel habits Musculoskeletal:negative for myalgias Neurological: negative for gait problems and tremors Behavioral/Psych: negative for abusive relationship, depression Endocrine: negative for temperature intolerance    Genitourinary: positive for vaginal discharge.;  negative for abnormal menstrual periods, genital lesions, hot flashes, sexual problems  Integument/breast: negative for breast lump, breast tenderness, nipple discharge and skin lesion(s)    Objective:       BP (!) 141/87   Pulse 97   Ht 5' 5.5" (1.664 m)   Wt 162 lb 9.6 oz (73.8 kg)   BMI  26.65 kg/m  General:   Alert and no distress  Skin:   no rash or abnormalities  Lungs:   clear to auscultation bilaterally  Heart:   regular rate and rhythm, S1, S2 normal, no murmur, click, rub or gallop  Breasts:   normal without suspicious masses, skin or nipple changes or axillary nodes  Abdomen:  normal findings: no organomegaly, soft, non-tender and no hernia  Pelvis:  External genitalia: normal general appearance Urinary system: urethral meatus normal and bladder without fullness, nontender Vaginal: normal without tenderness, induration or masses Cervix: normal appearance Adnexa: normal bimanual exam Uterus: anteverted and non-tender, normal size   Lab Review Urine pregnancy test Labs reviewed yes Radiologic studies reviewed no  I have spent a total of 20 minutes of face-to-face time, excluding clinical staff time, reviewing notes and preparing to see patient, ordering tests and/or medications, and counseling the patient.   Assessment:    1. Encounter for gynecological examination with Papanicolaou smear of cervix  Rx: - Cytology - PAP( [place])  2. Vaginal discharge Rx - Cervicovaginal ancillary only( [place])  3. Screening examination for STD (sexually transmitted disease) Rx: - Hepatitis B surface antigen - Hepatitis C antibody - HIV Antibody (routine testing w rflx) - RPR  4. IUD (intrauterine device) in place - doing well    Plan:      Education reviewed: calcium supplements, depression evaluation, low fat, low cholesterol diet, safe sex/STD prevention, self breast exams, and weight bearing exercise. Contraception: IUD. Follow up in: 1 year.  Meds ordered this encounter  Medications   ibuprofen (ADVIL) 800 MG tablet    Sig: Take 1 tablet (800 mg total) by mouth every 8 (eight) hours as needed.    Dispense:  30 tablet    Refill:  5   Orders Placed This Encounter  Procedures   Hepatitis B surface antigen   Hepatitis C antibody   HIV  Antibody (routine testing w rflx)   RPR    Shelly Bombard, MD 12/17/2022 8:49 AM

## 2022-12-18 ENCOUNTER — Other Ambulatory Visit: Payer: Self-pay | Admitting: Obstetrics

## 2022-12-18 DIAGNOSIS — B379 Candidiasis, unspecified: Secondary | ICD-10-CM

## 2022-12-18 DIAGNOSIS — B9689 Other specified bacterial agents as the cause of diseases classified elsewhere: Secondary | ICD-10-CM

## 2022-12-18 LAB — CERVICOVAGINAL ANCILLARY ONLY
Bacterial Vaginitis (gardnerella): POSITIVE — AB
Candida Glabrata: NEGATIVE
Candida Vaginitis: POSITIVE — AB
Chlamydia: NEGATIVE
Comment: NEGATIVE
Comment: NEGATIVE
Comment: NEGATIVE
Comment: NEGATIVE
Comment: NEGATIVE
Comment: NORMAL
Neisseria Gonorrhea: NEGATIVE
Trichomonas: NEGATIVE

## 2022-12-18 LAB — HIV ANTIBODY (ROUTINE TESTING W REFLEX): HIV Screen 4th Generation wRfx: NONREACTIVE

## 2022-12-18 LAB — HEPATITIS B SURFACE ANTIGEN: Hepatitis B Surface Ag: NEGATIVE

## 2022-12-18 LAB — HEPATITIS C ANTIBODY: Hep C Virus Ab: NONREACTIVE

## 2022-12-18 LAB — RPR: RPR Ser Ql: NONREACTIVE

## 2022-12-18 MED ORDER — TINIDAZOLE 500 MG PO TABS: 1000.0000 mg | ORAL_TABLET | Freq: Every day | ORAL | 2 refills | Status: DC

## 2022-12-18 MED ORDER — FLUCONAZOLE 150 MG PO TABS: 150.0000 mg | ORAL_TABLET | Freq: Once | ORAL | 0 refills | Status: AC

## 2022-12-19 ENCOUNTER — Telehealth: Payer: Self-pay | Admitting: Emergency Medicine

## 2022-12-19 NOTE — Telephone Encounter (Signed)
Attempted TC to patient to discuss results. LVM to return call to office.  

## 2022-12-19 NOTE — Telephone Encounter (Signed)
-----   Message from Shelly Bombard, MD sent at 12/18/2022  4:07 PM EST ----- Tindamax Rx for BV Diflucan Rx for yeast

## 2022-12-20 LAB — CYTOLOGY - PAP
Comment: NEGATIVE
Diagnosis: NEGATIVE
High risk HPV: NEGATIVE

## 2022-12-25 ENCOUNTER — Telehealth: Payer: Self-pay

## 2022-12-25 NOTE — Telephone Encounter (Signed)
S/w pt and she is aware of results and completed medication

## 2024-02-17 ENCOUNTER — Encounter: Payer: Self-pay | Admitting: Obstetrics and Gynecology

## 2024-02-17 ENCOUNTER — Other Ambulatory Visit (HOSPITAL_COMMUNITY)
Admission: RE | Admit: 2024-02-17 | Discharge: 2024-02-17 | Disposition: A | Discharge status: Home / Self Care | Payer: Self-pay | Source: Ambulatory Visit | Attending: Obstetrics and Gynecology | Admitting: Obstetrics and Gynecology

## 2024-02-17 ENCOUNTER — Ambulatory Visit: Payer: Self-pay | Admitting: Obstetrics and Gynecology

## 2024-02-17 VITALS — BP 122/79 | HR 70 | Ht 65.0 in | Wt 168.0 lb

## 2024-02-17 DIAGNOSIS — Z1339 Encounter for screening examination for other mental health and behavioral disorders: Secondary | ICD-10-CM

## 2024-02-17 DIAGNOSIS — Z01419 Encounter for gynecological examination (general) (routine) without abnormal findings: Secondary | ICD-10-CM

## 2024-02-17 MED ORDER — FLUCONAZOLE 150 MG PO TABS: 150.0000 mg | ORAL_TABLET | Freq: Once | ORAL | 5 refills | Status: AC

## 2024-02-17 NOTE — Progress Notes (Signed)
 43 y.o. GYN presents for AEX/STD Screening.  C/o recurrent Yeast Infections.

## 2024-02-17 NOTE — Progress Notes (Signed)
 Subjective:     Marie Watkins is a 43 y.o. female P5 with LMP 01/27/24 and BMI 27 who is here for a comprehensive physical exam. The patient reports concerns regarding recurrent vaginitis. She reports a period every 1-2 months for the past few years. She is sexually active using Paraguard IUD (due for removal in 2 years). She denies pelvic pain or abnormal discharge. She denies urinary incontinence or issues with bowel movements  Past Medical History:  Diagnosis Date   Abnormal Pap smear    Medical history non-contributory    No pertinent past medical history    Past Surgical History:  Procedure Laterality Date   CESAREAN SECTION     COLPOSCOPY     DILATION AND CURETTAGE OF UTERUS     TONSILLECTOMY     Family History  Family history unknown: Yes    Social History   Socioeconomic History   Marital status: Single    Spouse name: Not on file   Number of children: Not on file   Years of education: Not on file   Highest education level: Not on file  Occupational History   Not on file  Tobacco Use   Smoking status: Never   Smokeless tobacco: Never  Vaping Use   Vaping status: Never Used  Substance and Sexual Activity   Alcohol use: No    Alcohol/week: 0.0 standard drinks of alcohol   Drug use: No   Sexual activity: Yes    Partners: Male    Birth control/protection: None, I.U.D.  Other Topics Concern   Not on file  Social History Narrative   Not on file   Social Drivers of Health   Financial Resource Strain: Not on file  Food Insecurity: Not on file  Transportation Needs: Not on file  Physical Activity: Not on file  Stress: Not on file  Social Connections: Not on file  Intimate Partner Violence: Not on file   Health Maintenance  Topic Date Due   DTaP/Tdap/Td (2 - Td or Tdap) 02/21/2022   INFLUENZA VACCINE  Never done   COVID-19 Vaccine (1 - 2024-25 season) Never done   Cervical Cancer Screening (HPV/Pap Cot[facility])  12/18/2027   Hepatitis C Screening  Completed    HIV Screening  Completed   HPV VACCINES  Aged Out       Review of Systems Pertinent items noted in HPI and remainder of comprehensive ROS otherwise negative.   Objective:  Blood pressure 122/79, pulse 70, height 5\' 5"  (1.651 m), weight 168 lb (76.2 kg), last menstrual period 01/27/2024.   GENERAL: Well-developed, well-nourished female in no acute distress.  HEENT: Normocephalic, atraumatic. Sclerae anicteric.  NECK: Supple. Normal thyroid.  LUNGS: Clear to auscultation bilaterally.  HEART: Regular rate and rhythm. BREASTS: Symmetric in size. No palpable masses or lymphadenopathy, skin changes, or nipple drainage. ABDOMEN: Soft, nontender, nondistended. No organomegaly. PELVIC: Normal external female genitalia. Vagina is pink and rugated.  Normal discharge. Normal appearing cervix. Uterus is normal in size. No adnexal mass or tenderness. Chaperone present during the pelvic exam EXTREMITIES: No cyanosis, clubbing, or edema, 2+ distal pulses.     Assessment:    Healthy female exam.      Plan:    Screening mammogram ordered Pap smear collected STI screening per patient request Health maintenance labs ordered Patient will be contacted with abnormal results Discussed perineal care to decrease recurrence of vaginitis See After Visit Summary for Counseling Recommendations

## 2024-02-18 LAB — CBC
Hematocrit: 34.5 % (ref 34.0–46.6)
Hemoglobin: 9.9 g/dL — ABNORMAL LOW (ref 11.1–15.9)
MCH: 19.7 pg — ABNORMAL LOW (ref 26.6–33.0)
MCHC: 28.7 g/dL — ABNORMAL LOW (ref 31.5–35.7)
MCV: 69 fL — ABNORMAL LOW (ref 79–97)
Platelets: 409 10*3/uL (ref 150–450)
RBC: 5.03 x10E6/uL (ref 3.77–5.28)
RDW: 15.4 % (ref 11.7–15.4)
WBC: 7.9 10*3/uL (ref 3.4–10.8)

## 2024-02-18 LAB — COMPREHENSIVE METABOLIC PANEL
ALT: 13 IU/L (ref 0–32)
AST: 19 IU/L (ref 0–40)
Albumin: 4.5 g/dL (ref 3.9–4.9)
Alkaline Phosphatase: 113 IU/L (ref 44–121)
BUN/Creatinine Ratio: 6 — ABNORMAL LOW (ref 9–23)
BUN: 5 mg/dL — ABNORMAL LOW (ref 6–24)
Bilirubin Total: 0.2 mg/dL (ref 0.0–1.2)
CO2: 20 mmol/L (ref 20–29)
Calcium: 9.8 mg/dL (ref 8.7–10.2)
Chloride: 102 mmol/L (ref 96–106)
Creatinine, Ser: 0.8 mg/dL (ref 0.57–1.00)
Globulin, Total: 3.7 g/dL (ref 1.5–4.5)
Glucose: 85 mg/dL (ref 70–99)
Potassium: 4.2 mmol/L (ref 3.5–5.2)
Sodium: 136 mmol/L (ref 134–144)
Total Protein: 8.2 g/dL (ref 6.0–8.5)
eGFR: 95 mL/min/{1.73_m2} (ref >59)

## 2024-02-18 LAB — CERVICOVAGINAL ANCILLARY ONLY
Bacterial Vaginitis (gardnerella): POSITIVE — AB
Candida Glabrata: NEGATIVE
Candida Vaginitis: POSITIVE — AB
Chlamydia: NEGATIVE
Comment: NEGATIVE
Comment: NEGATIVE
Comment: NEGATIVE
Comment: NEGATIVE
Comment: NEGATIVE
Comment: NORMAL
Neisseria Gonorrhea: NEGATIVE
Trichomonas: NEGATIVE

## 2024-02-18 LAB — LIPID PANEL
Chol/HDL Ratio: 2.4 ratio (ref 0.0–4.4)
Cholesterol, Total: 170 mg/dL (ref 100–199)
HDL: 70 mg/dL (ref >39)
LDL Chol Calc (NIH): 90 mg/dL (ref 0–99)
Triglycerides: 46 mg/dL (ref 0–149)
VLDL Cholesterol Cal: 10 mg/dL (ref 5–40)

## 2024-02-18 LAB — RPR: RPR Ser Ql: NONREACTIVE

## 2024-02-18 LAB — HIV ANTIBODY (ROUTINE TESTING W REFLEX): HIV Screen 4th Generation wRfx: NONREACTIVE

## 2024-02-18 LAB — HEMOGLOBIN A1C
Est. average glucose Bld gHb Est-mCnc: 128 mg/dL
Hgb A1c MFr Bld: 6.1 % — ABNORMAL HIGH (ref 4.8–5.6)

## 2024-02-18 LAB — HEPATITIS B SURFACE ANTIGEN: Hepatitis B Surface Ag: NEGATIVE

## 2024-02-18 LAB — HEPATITIS C ANTIBODY: Hep C Virus Ab: NONREACTIVE

## 2024-02-18 LAB — TSH: TSH: 0.005 u[IU]/mL — ABNORMAL LOW (ref 0.450–4.500)

## 2024-02-19 ENCOUNTER — Encounter: Payer: Self-pay | Admitting: Obstetrics & Gynecology

## 2024-02-19 ENCOUNTER — Other Ambulatory Visit: Payer: Self-pay | Admitting: Obstetrics & Gynecology

## 2024-02-19 DIAGNOSIS — N760 Acute vaginitis: Secondary | ICD-10-CM | POA: Insufficient documentation

## 2024-02-19 DIAGNOSIS — R7303 Prediabetes: Secondary | ICD-10-CM

## 2024-02-19 HISTORY — DX: Prediabetes: R73.03

## 2024-02-19 MED ORDER — METRONIDAZOLE 0.75 % VA GEL: 1.0000 | Freq: Every day | VAGINAL | 5 refills | Status: AC

## 2024-02-19 MED ORDER — FLUCONAZOLE 150 MG PO TABS: ORAL_TABLET | ORAL | 1 refills | Status: AC

## 2024-02-23 LAB — T4: T4, Total: 14.9 ug/dL — ABNORMAL HIGH (ref 4.5–12.0)

## 2024-02-23 LAB — CYTOLOGY - PAP: Diagnosis: NEGATIVE

## 2024-02-23 LAB — SPECIMEN STATUS REPORT

## 2024-02-23 LAB — T3: T3, Total: 246 ng/dL — ABNORMAL HIGH (ref 71–180)

## 2024-02-23 NOTE — Progress Notes (Signed)
 Labs were added on Friday 02/20/24.

## 2024-02-24 ENCOUNTER — Encounter: Payer: Self-pay | Admitting: Obstetrics & Gynecology

## 2024-02-24 NOTE — Progress Notes (Signed)
 Labs are concerning for hyperthyroidism.  Patient needs to follow up with PCP for further evaluation and management, can be referred to any of the [facility] primary care/internal medicine offices.  Please call to inform patient of results and recommendations.   Jaynie Collins, MD
